# Patient Record
Sex: Male | Born: 1985 | Race: White | Hispanic: No | Marital: Married | Smoking: Former smoker
Health system: Southern US, Community
[De-identification: ages and names within clinical notes are randomized; demographics above are authoritative.]

## PROBLEM LIST (undated history)

## (undated) DIAGNOSIS — I10 Essential (primary) hypertension: Secondary | ICD-10-CM

## (undated) HISTORY — PX: APPENDECTOMY: SHX54

---

## 2013-07-19 ENCOUNTER — Other Ambulatory Visit: Payer: Self-pay | Admitting: Occupational Medicine

## 2013-07-19 ENCOUNTER — Ambulatory Visit
Admission: RE | Admit: 2013-07-19 | Discharge: 2013-07-19 | Disposition: A | Discharge status: Home / Self Care | Payer: Self-pay | Source: Ambulatory Visit | Attending: Occupational Medicine | Admitting: Occupational Medicine

## 2013-07-19 DIAGNOSIS — Z021 Encounter for pre-employment examination: Secondary | ICD-10-CM

## 2014-02-06 ENCOUNTER — Emergency Department (HOSPITAL_COMMUNITY)
Admission: EM | Admit: 2014-02-06 | Discharge: 2014-02-06 | Disposition: A | Discharge status: Home / Self Care | Payer: 59 | Source: Home / Self Care | Attending: Family Medicine | Admitting: Family Medicine

## 2014-02-06 ENCOUNTER — Encounter (HOSPITAL_COMMUNITY): Payer: Self-pay | Admitting: Emergency Medicine

## 2014-02-06 DIAGNOSIS — H6990 Unspecified Eustachian tube disorder, unspecified ear: Secondary | ICD-10-CM

## 2014-02-06 DIAGNOSIS — H6980 Other specified disorders of Eustachian tube, unspecified ear: Secondary | ICD-10-CM

## 2014-02-06 DIAGNOSIS — H6981 Other specified disorders of Eustachian tube, right ear: Secondary | ICD-10-CM

## 2014-02-06 MED ORDER — IPRATROPIUM BROMIDE 0.06 % NA SOLN: 2.0000 | Freq: Four times a day (QID) | NASAL | Status: DC

## 2014-02-06 MED ORDER — PSEUDOEPHEDRINE HCL 60 MG PO TABS: 60.0000 mg | ORAL_TABLET | Freq: Every day | ORAL | Status: DC

## 2014-02-06 NOTE — ED Provider Notes (Signed)
CSN: [id number]     Arrival date & time 02/06/14  4098 History   First MD Initiated Contact with Patient 02/06/14 1120     Chief Complaint  Patient presents with  . Cerumen Impaction   (Consider location/radiation/quality/duration/timing/severity/associated sxs/prior Treatment) HPI Comments: Patient developed the sense of muffled hearing, ear congestion, and dizziness with changes in head position last night. Denies fever, ear pain, recent illness, injury or drainage from ear canal. Symptoms are in right ear.  Reports himself to be otherwise healthy. Works as Astronomer. No recent activities that would cause barotrauma.   The history is provided by the patient.    History reviewed. No pertinent past medical history. History reviewed. No pertinent past surgical history. History reviewed. No pertinent family history. History  Substance Use Topics  . Smoking status: Not on file  . Smokeless tobacco: Not on file  . Alcohol Use: No    Review of Systems  All other systems reviewed and are negative.   Allergies  Review of patient's allergies indicates no known allergies.  Home Medications   Prior to Admission medications   Medication Sig Start Date End Date Taking? Authorizing Provider  ipratropium (ATROVENT) 0.06 % nasal spray Place 2 sprays into both nostrils 4 (four) times daily. X 7 days 02/06/14   Ria Clock, PA  pseudoephedrine (SUDAFED) 60 MG tablet Take 1 tablet (60 mg total) by mouth daily with breakfast. X 5 days 02/06/14   Mathis Fare Presson, PA   BP 113/78  Pulse 60  Temp(Src) 98.2 F (36.8 C) (Oral)  Resp 18  SpO2 100% Physical Exam  Nursing note and vitals reviewed. Constitutional: He is oriented to person, place, and time. He appears well-developed and well-nourished. No distress.  HENT:  Head: Normocephalic and atraumatic.  Right Ear: External ear and ear canal normal. A middle ear effusion is present. Decreased hearing is noted.  Left  Ear: Hearing, tympanic membrane, external ear and ear canal normal.  Nose: Nose normal.  Mouth/Throat: Uvula is midline, oropharynx is clear and moist and mucous membranes are normal. No oral lesions. No trismus in the jaw. No uvula swelling.  +moderate clear fluid effusion at right middle ear.   Eyes: Conjunctivae and EOM are normal. Pupils are equal, round, and reactive to light. Right eye exhibits no discharge. Left eye exhibits no discharge. No scleral icterus.  Neck: Normal range of motion. Neck supple.  Cardiovascular: Normal rate, regular rhythm and normal heart sounds.   Pulmonary/Chest: Effort normal and breath sounds normal.  Musculoskeletal: Normal range of motion.  Lymphadenopathy:    He has no cervical adenopathy.  Neurological: He is alert and oriented to person, place, and time.  Skin: Skin is warm and dry. No rash noted. No erythema.  Psychiatric: His behavior is normal.    ED Course  Procedures (including critical care time) Labs Review Labs Reviewed - No data to display  Imaging Review No results found.   MDM   1. Eustachian tube dysfunction, right   I suspect his symptoms are a result of mild eustachian tube dysfunction causing middle ear effusion. Will advise using Sudafed and Atrovent as prescribed and if no improvement over the next 5-7 days, follow up with Dr. Suszanne Conners (ENT).     Ria Clock, [place] 02/06/14 [phone]

## 2014-02-06 NOTE — Discharge Instructions (Signed)
See attached regarding eustachian tube dysfunction

## 2014-02-06 NOTE — ED Notes (Signed)
Pt  Has  Sensation  Of      Fullness  In  r       Ear           X  sev  Days           No  Pain  No       Drainage

## 2014-02-07 NOTE — ED Provider Notes (Signed)
Medical screening examination/treatment/procedure(s) were performed by a resident physician or non-physician practitioner and as the supervising physician I was immediately available for consultation/collaboration.  Shelly Flattenavid Merrell, MD Family Medicine   Ozella Rocksavid J Merrell, MD 02/07/14 [phone]

## 2014-11-07 ENCOUNTER — Ambulatory Visit (INDEPENDENT_AMBULATORY_CARE_PROVIDER_SITE_OTHER): Payer: 59 | Admitting: Physician Assistant

## 2014-11-07 VITALS — BP 120/84 | HR 73 | Temp 98.3°F | Resp 16 | Ht 71.0 in | Wt 210.4 lb

## 2014-11-07 DIAGNOSIS — M542 Cervicalgia: Secondary | ICD-10-CM

## 2014-11-07 DIAGNOSIS — R202 Paresthesia of skin: Secondary | ICD-10-CM

## 2014-11-07 MED ORDER — PREDNISONE 20 MG PO TABS: ORAL_TABLET | ORAL | Status: DC

## 2014-11-07 NOTE — Progress Notes (Signed)
11/07/2014 at 4:18 PM  Raymond Raymond / DOB: 1986-03-07 / MRN: [id number]  The patient  does not have a problem list on file.  SUBJECTIVE  Chief complaint: possible pinched nerve and Numbness  Patient here for neck pain with radiation to left shoulder.  Reports it started as a "crick in the neck" two days ago and is no having increasing pain with radiation to the left shoulder.  States his pain is worse with head and neck movement.  Has tried ibuprofen 800 tid with mild relief.  Denies change in bowel and bladder, weakness, and change in dexterity.  He has never had any issues with his neck before.  Reports he was doing a new work out 4 days ago and denies any acute injury at that time.    He  has no past medical history on file.    Medications reviewed and updated by myself where necessary, and exist elsewhere in the encounter.   Mr. Lyster has No Known Allergies. He  reports that he has been smoking.  He does not have any smokeless tobacco history on file. He reports that he drinks alcohol. He reports that he does not use illicit drugs. He  has no sexual activity history on file. The patient  has past surgical history that includes Appendectomy.  His family history is not on file.  Review of Systems  Constitutional: Negative for fever.  Gastrointestinal: Negative for nausea.  Musculoskeletal: Positive for neck pain.  Skin: Negative for rash.  Neurological: Negative for dizziness and headaches.    OBJECTIVE  His  height is  (1.803 m) and weight is 210 lb 6.4 oz (95.437 kg). His oral temperature is 98.3 F (36.8 C). His blood pressure is 120/84 and his pulse is 73. His respiration is 16 and oxygen saturation is 99%.  The patient's body mass index is 29.36 kg/(m^2).  Physical Exam  Cardiovascular: Normal rate.   Respiratory: Effort normal.  Musculoskeletal: Normal range of motion.       Cervical back: He exhibits tenderness and pain. He exhibits normal range of motion, no bony  tenderness, no swelling, no edema, no deformity, no laceration, no spasm and normal pulse.       Back:  Neurological: He has normal strength. He displays no tremor. No cranial nerve deficit or sensory deficit. He exhibits normal muscle tone. Coordination normal.  Reflex Scores:      Tricep reflexes are 2+ on the right side and 2+ on the left side.      Bicep reflexes are 2+ on the right side and 2+ on the left side.      Brachioradialis reflexes are 2+ on the right side and 2+ on the left side.   No results found for this or any previous visit (from the past 24 hour(s)).  ASSESSMENT & PLAN  Dolph was seen today for possible pinched nerve and numbness.  Diagnoses and all orders for this visit:  Neck pain: Patient with no previous history of neck pain and without alarming symptoms.  He has failed ibuprofen at an appropriate dose.  Will step up therapy.  He is to follow up here if he does not have any improvement in ten days.   Orders: -     predniSONE (DELTASONE) 20 MG tablet; Take 3 PO QAM x3days, 2 PO QAM x3days, 1 PO QAM x3days  Paresthesia of left upper extremity Orders: -     predniSONE (DELTASONE) 20 MG tablet; Take 3 PO QAM  x3days, 2 PO QAM x3days, 1 PO QAM x3days    The patient was advised to call or come back to clinic if he does not see an improvement in symptoms, or worsens with the above plan.   Raymond BostonMichael Nyaire Raymond, MHS, PA-C [facility] 11/07/2014 4:18 PM

## 2015-09-18 ENCOUNTER — Encounter: Payer: Self-pay | Admitting: Emergency Medicine

## 2015-09-18 ENCOUNTER — Emergency Department
Admission: EM | Admit: 2015-09-18 | Discharge: 2015-09-18 | Disposition: A | Discharge status: Home / Self Care | Payer: 59 | Source: Home / Self Care | Attending: Emergency Medicine | Admitting: Emergency Medicine

## 2015-09-18 DIAGNOSIS — J029 Acute pharyngitis, unspecified: Secondary | ICD-10-CM | POA: Diagnosis not present

## 2015-09-18 MED ORDER — AMOXICILLIN 500 MG PO CAPS: 500.0000 mg | ORAL_CAPSULE | Freq: Three times a day (TID) | ORAL | Status: DC

## 2015-09-18 NOTE — ED Notes (Signed)
Pt c/o sore throat and nasal congestion x2 days. Denies fever.

## 2015-09-18 NOTE — ED Provider Notes (Signed)
CSN: [id number]     Arrival date & time 09/18/15  1705 History   First MD Initiated Contact with Patient 09/18/15 1747     Chief Complaint  Patient presents with  . Sore Throat  . Nasal Congestion   (Consider location/radiation/quality/duration/timing/severity/associated sxs/prior Treatment) Patient is a 30 y.o. male presenting with pharyngitis. The history is provided by the patient.  Sore Throat This is a new problem. The current episode started 2 days ago. The problem occurs constantly. The problem has been gradually worsening. Pertinent negatives include no chest pain and no shortness of breath. Nothing aggravates the symptoms. Nothing relieves the symptoms. He has tried nothing for the symptoms. The treatment provided no relief.   Pt complains of throat felling like razors.  History reviewed. No pertinent past medical history. Past Surgical History  Procedure Laterality Date  . Appendectomy     History reviewed. No pertinent family history. Social History  Substance Use Topics  . Smoking status: Former Games developermoker  . Smokeless tobacco: Current User    Types: Snuff  . Alcohol Use: 0.0 oz/week    0 Standard drinks or equivalent per week    Review of Systems  Respiratory: Negative for shortness of breath.   Cardiovascular: Negative for chest pain.  All other systems reviewed and are negative.   Allergies  Review of patient's allergies indicates no known allergies.  Home Medications   Prior to Admission medications   Medication Sig Start Date End Date Taking? Authorizing Provider  ipratropium (ATROVENT) 0.06 % nasal spray Place 2 sprays into both nostrils 4 (four) times daily. X 7 days Patient not taking: Reported on 11/07/2014 02/06/14   Ria ClockJennifer Lee H Presson, PA  predniSONE (DELTASONE) 20 MG tablet Take 3 PO QAM x3days, 2 PO QAM x3days, 1 PO QAM x3days 11/07/14   Ofilia NeasMichael L Clark, PA-C  pseudoephedrine (SUDAFED) 60 MG tablet Take 1 tablet (60 mg total) by mouth daily with  breakfast. X 5 days Patient not taking: Reported on 11/07/2014 02/06/14   Ria ClockJennifer Lee H Presson, PA   Meds Ordered and Administered this Visit  Medications - No data to display  BP 127/81 mmHg  Pulse 75  Temp(Src) 97.8 F (36.6 C) (Oral)  Wt 211 lb (95.709 kg)  SpO2 97% No data found.   Physical Exam  Constitutional: He is oriented to person, place, and time. He appears well-developed and well-nourished.  HENT:  Head: Normocephalic.  Eyes: EOM are normal.  Neck: Normal range of motion.  Cardiovascular: Normal rate.   Pulmonary/Chest: Effort normal.  Abdominal: Soft. He exhibits no distension.  Musculoskeletal: Normal range of motion.  Neurological: He is alert and oriented to person, place, and time.  Skin: There is erythema.  Psychiatric: He has a normal mood and affect.  Nursing note and vitals reviewed.   ED Course  Procedures (including critical care time)  Labs Review Labs Reviewed - No data to display  Imaging Review No results found.   Visual Acuity Review  Right Eye Distance:   Left Eye Distance:   Bilateral Distance:    Right Eye Near:   Left Eye Near:    Bilateral Near:         MDM   1. Acute pharyngitis, unspecified etiology    Meds ordered this encounter  Medications  . DISCONTD: amoxicillin (AMOXIL) 500 MG capsule    Sig: Take 1 capsule (500 mg total) by mouth 3 (three) times daily.    Dispense:  30 capsule  Refill:  0    Order Specific Question:  Supervising Provider    Answer:  Georgina Pillion, DAVID [[id number]]  . amoxicillin (AMOXIL) 500 MG capsule    Sig: Take 1 capsule (500 mg total) by mouth 3 (three) times daily.    Dispense:  30 capsule    Refill:  0    Order Specific Question:  Supervising Provider    Answer:  Georgina Pillion, DAVID [[id number]]  An After Visit Summary was printed and given to the patient.    Lonia Skinner [place], PA-C 09/18/15 [phone]

## 2015-09-18 NOTE — Discharge Instructions (Signed)

## 2015-11-29 ENCOUNTER — Encounter: Payer: Self-pay | Admitting: Family Medicine

## 2015-11-29 ENCOUNTER — Ambulatory Visit (INDEPENDENT_AMBULATORY_CARE_PROVIDER_SITE_OTHER): Payer: 59 | Admitting: Family Medicine

## 2015-11-29 VITALS — BP 122/80 | HR 64 | Ht 70.0 in | Wt 236.0 lb

## 2015-11-29 DIAGNOSIS — Z0000 Encounter for general adult medical examination without abnormal findings: Secondary | ICD-10-CM | POA: Insufficient documentation

## 2015-11-29 DIAGNOSIS — Z114 Encounter for screening for human immunodeficiency virus [HIV]: Secondary | ICD-10-CM

## 2015-11-29 DIAGNOSIS — Z23 Encounter for immunization: Secondary | ICD-10-CM

## 2015-11-29 DIAGNOSIS — Z720 Tobacco use: Secondary | ICD-10-CM | POA: Diagnosis not present

## 2015-11-29 DIAGNOSIS — IMO0001 Reserved for inherently not codable concepts without codable children: Secondary | ICD-10-CM

## 2015-11-29 DIAGNOSIS — E669 Obesity, unspecified: Secondary | ICD-10-CM

## 2015-11-29 NOTE — Patient Instructions (Signed)
Thank you for coming in today. Return in 1 year or sooner.  Work on reducing tobacco.  Keep active and try to keep the weight off.  Return for TB test and blood work soon.

## 2015-11-29 NOTE — Progress Notes (Signed)
       Raymond [name] is a 30 y.o. male who [name] today for a wellness visit.  Patient has no known medical problems and takes no medications.  He and his wife are fostering a child and he needs a health check up.    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  He admits to eating fast food and gas station food frequently when working.  Patient exercises 3-4 times per week.  He reports going through a can of chewing tobacco every day and drinking 10-15 beers per week.  Denies history of psychiatric disease.  Denies depression and anhedonia.     History reviewed. No pertinent past medical history. Past Surgical History  Procedure Laterality Date  . Appendectomy     Social History  Substance Use Topics  . Smoking status: Former Games developermoker  . Smokeless tobacco: Current User    Types: Snuff  . Alcohol Use: 6.0 oz/week    0 Standard drinks or equivalent, 10 Cans of beer per week   family history includes Cancer in his mother.  ROS as above: No headache, visual changes, nausea, vomiting, diarrhea, constipation, dizziness, abdominal pain, skin rash, fevers, chills, night sweats, weight loss, swollen lymph nodes, body aches, joint swelling, muscle aches, chest pain, shortness of breath, mood changes, visual or auditory hallucinations.   Medications: No current outpatient prescriptions on file.   No current facility-administered medications for this visit.   No Known Allergies   Exam:  BP 122/80 mmHg  Pulse 64  Ht 5\' 10"  (1.778 m)  Wt 236 lb (107.049 kg)  BMI 33.86 kg/m2 Gen: Well NAD HEENT:  MMM Lungs: Normal work of breathing. CTABL Heart: RRR no MRG Abd: NABS, Soft. Nondistended, Nontender. Obese  Exts: Brisk capillary refill, warm and well perfused.   [facility] 11/29/2015 11/07/2014  Decreased Interest 0 0    Down, Depressed, Hopeless 0 0  PHQ - 2 Score 0 0    No results found for this or any previous visit (from the past 24 hour(s)). No results found.   Assessment and Plan: 30 y.o. male with a wellness visit. Patient has continued chewing tobacco and alcohol use. Counseled patient on the importance of at least cutting down on chewing tobacco.    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  He will return next week for fasting labs.   Discussed warning signs or symptoms. Please see discharge instructions. Patient expresses understanding.

## 2015-12-15 LAB — COMPREHENSIVE METABOLIC PANEL
ALK PHOS: 51 U/L (ref 40–115)
ALT: 12 U/L (ref 9–46)
AST: 13 U/L (ref 10–40)
Albumin: 4.1 g/dL (ref 3.6–5.1)
BILIRUBIN TOTAL: 0.8 mg/dL (ref 0.2–1.2)
BUN: 8 mg/dL (ref 7–25)
CALCIUM: 9.1 mg/dL (ref 8.6–10.3)
CO2: 27 mmol/L (ref 20–31)
CREATININE: 1 mg/dL (ref 0.60–1.35)
Chloride: 106 mmol/L (ref 98–110)
GLUCOSE: 84 mg/dL (ref 65–99)
Potassium: 4.4 mmol/L (ref 3.5–5.3)
Sodium: 140 mmol/L (ref 135–146)
Total Protein: 6.5 g/dL (ref 6.1–8.1)

## 2015-12-15 LAB — LIPID PANEL
Cholesterol: 175 mg/dL (ref 125–200)
HDL: 74 mg/dL (ref >=40)
LDL CALC: 89 mg/dL (ref <130)
Total CHOL/HDL Ratio: 2.4 Ratio (ref <=5.0)
Triglycerides: 60 mg/dL (ref <150)
VLDL: 12 mg/dL (ref <30)

## 2015-12-15 LAB — CBC
HEMATOCRIT: 45.4 % (ref 38.5–50.0)
Hemoglobin: 15.1 g/dL (ref 13.2–17.1)
MCH: 30.4 pg (ref 27.0–33.0)
MCHC: 33.3 g/dL (ref 32.0–36.0)
MCV: 91.5 fL (ref 80.0–100.0)
MPV: 10.3 fL (ref 7.5–12.5)
PLATELETS: 168 10*3/uL (ref 140–400)
RBC: 4.96 MIL/uL (ref 4.20–5.80)
RDW: 13.3 % (ref 11.0–15.0)
WBC: 5.3 10*3/uL (ref 3.8–10.8)

## 2015-12-15 LAB — HEMOGLOBIN A1C
Hgb A1c MFr Bld: 5.4 % (ref <5.7)
Mean Plasma Glucose: 108 mg/dL

## 2015-12-15 LAB — TSH: TSH: 0.97 m[IU]/L (ref 0.40–4.50)

## 2015-12-15 LAB — HIV ANTIBODY (ROUTINE TESTING W REFLEX): HIV: NONREACTIVE

## 2015-12-15 LAB — VITAMIN D 25 HYDROXY (VIT D DEFICIENCY, FRACTURES): Vit D, 25-Hydroxy: 28 ng/mL — ABNORMAL LOW (ref 30–100)

## 2015-12-16 ENCOUNTER — Encounter: Payer: Self-pay | Admitting: Family Medicine

## 2015-12-16 DIAGNOSIS — E559 Vitamin D deficiency, unspecified: Secondary | ICD-10-CM | POA: Insufficient documentation

## 2016-08-15 ENCOUNTER — Emergency Department
Admission: EM | Admit: 2016-08-15 | Discharge: 2016-08-15 | Disposition: A | Discharge status: Home / Self Care | Payer: 59 | Source: Home / Self Care | Attending: Family Medicine | Admitting: Family Medicine

## 2016-08-15 ENCOUNTER — Encounter: Payer: Self-pay | Admitting: Emergency Medicine

## 2016-08-15 DIAGNOSIS — S76311A Strain of muscle, fascia and tendon of the posterior muscle group at thigh level, right thigh, initial encounter: Secondary | ICD-10-CM

## 2016-08-15 NOTE — Discharge Instructions (Signed)
°  You may apply an ace wrap or other similar elastic wrap to help give your muscle extra support and provide comfort.    You should use cool compresses such as an ice pack today and tomorrow, then transition into using a warm compress such as a heating pad or warm washcloth in the next few days to help muscle healing.    See more info about hamstring strain in this packet including home exercises as pain improves.   You may take acetaminophen (tylenol)  every 4-6 hours (up to  the first dose) and  600-800mg  ibuprofen every 6-8 hours for pain.

## 2016-08-15 NOTE — ED Provider Notes (Signed)
CSN: [id number]     Arrival date & time 08/15/16  1503 History   First MD Initiated Contact with Patient 08/15/16 1525     Chief Complaint  Patient presents with  . Leg Pain    upper   (Consider location/radiation/quality/duration/timing/severity/associated sxs/prior Treatment) HPI  Raymond [name] is a 31 y.o. male presenting to UC with c/o sudden onset Right upper posterior leg pain that started last night while playing softball. Pt notes when he was rounding 2nd base he felt a popping sensation and immediate pain which caused him to stop running.  Pain gradually improved so he tried to play the rest of the game at 3rd base but could not last after making 1 play due to the pain. Pain is worse with ambulation and certain seated positions, aching and sore, 5/10.  He did take ibuprofen with moderate relief. 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  His next scheduled shift is not until Monday, 08/18/16.  Pt is also requesting a note to wear an external vest and web belt, which is lighter/more comfortable than current equipment to help with back pain.    PCP: Dr. Clementeen Graham, Sports Medicine/Primary Care.    History reviewed. No pertinent past medical history. Past Surgical History:  Procedure Laterality Date  . APPENDECTOMY     Family History  Problem Relation Age of Onset  . Cancer Mother    Social History  Substance Use Topics  . Smoking status: Former Games developer  . Smokeless tobacco: Current User    Types: Snuff  . Alcohol use 6.0 oz/week    10 Cans of beer per week    Review of Systems  Musculoskeletal: Positive for back pain, gait problem and myalgias. Negative for arthralgias and joint swelling.  Skin: Negative for color change, rash and wound.  Neurological: Negative for weakness and numbness.    Allergies  Patient has no known allergies.  Home Medications   Prior to Admission medications   Not on File   Meds Ordered and Administered  this Visit  Medications - No data to display  BP 124/85 (BP Location: Left Arm)   Pulse 100   Temp 98.4 F (36.9 C) (Oral)   Resp 16   Ht  (1.778 m)   Wt 240 lb (108.9 kg)   SpO2 98%   BMI 34.44 kg/m  No data found.   Physical Exam  Constitutional: He is oriented to person, place, and time. He appears well-developed and well-nourished.  HENT:  Head: Normocephalic and atraumatic.  Eyes: EOM are normal.  Neck: Normal range of motion.  Cardiovascular: Normal rate.   Pulmonary/Chest: Effort normal.  Musculoskeletal: Normal range of motion. He exhibits tenderness. He exhibits no edema or deformity.  Right upper leg: no obvious edema or deformity.  Tenderness to middle of posterior hamstring. Muscle compartment is soft. No bruising noted. Full ROM Right hip, knee and ankle.   Neurological: He is alert and oriented to person, place, and time.  Skin: Skin is warm and dry.  Psychiatric: He has a normal mood and affect. His behavior is normal.  Nursing note and vitals reviewed.   Urgent Care Course     Procedures (including critical care time)  Labs Review Labs Reviewed - No data to display  Imaging Review No results found.    MDM   1. Right hamstring muscle strain, initial encounter    Hx and exam c/w Right hamstring muscle  strain.  Ace wrap applied for comfort. Encouraged alternating ice and heat. May take acetaminophen and ibuprofen. Home care instructions with home exercises provided for pt to start as pain eases off.  Work note provided for tonight off. f/u with Dr. Denyse Amass, PCP/Sports Medicine next week if not improving.     Junius Finner, PA-C 08/15/16 [phone]

## 2016-08-15 NOTE — ED Triage Notes (Signed)
Patient was running bases during softball last night and felt popping sensation in upperright leg. Now painful and has taken ibuprofen this morning.

## 2016-11-17 ENCOUNTER — Encounter (HOSPITAL_BASED_OUTPATIENT_CLINIC_OR_DEPARTMENT_OTHER): Payer: Self-pay | Admitting: *Deleted

## 2016-11-17 ENCOUNTER — Emergency Department (HOSPITAL_BASED_OUTPATIENT_CLINIC_OR_DEPARTMENT_OTHER)
Admission: EM | Admit: 2016-11-17 | Discharge: 2016-11-17 | Disposition: A | Discharge status: Home / Self Care | Payer: Worker's Compensation | Attending: Emergency Medicine | Admitting: Emergency Medicine

## 2016-11-17 ENCOUNTER — Emergency Department (HOSPITAL_BASED_OUTPATIENT_CLINIC_OR_DEPARTMENT_OTHER): Payer: Worker's Compensation

## 2016-11-17 DIAGNOSIS — S6990XA Unspecified injury of unspecified wrist, hand and finger(s), initial encounter: Secondary | ICD-10-CM

## 2016-11-17 DIAGNOSIS — Y929 Unspecified place or not applicable: Secondary | ICD-10-CM | POA: Diagnosis not present

## 2016-11-17 DIAGNOSIS — F17220 Nicotine dependence, chewing tobacco, uncomplicated: Secondary | ICD-10-CM | POA: Insufficient documentation

## 2016-11-17 DIAGNOSIS — R000 Tachycardia, unspecified: Secondary | ICD-10-CM | POA: Insufficient documentation

## 2016-11-17 DIAGNOSIS — Y990 Civilian activity done for income or pay: Secondary | ICD-10-CM | POA: Insufficient documentation

## 2016-11-17 DIAGNOSIS — S60940A Unspecified superficial injury of right index finger, initial encounter: Secondary | ICD-10-CM | POA: Diagnosis present

## 2016-11-17 DIAGNOSIS — Y939 Activity, unspecified: Secondary | ICD-10-CM | POA: Diagnosis not present

## 2016-11-17 NOTE — ED Notes (Signed)
Patient transported to X-ray 
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
## 2016-11-17 NOTE — ED Notes (Signed)
Pt verbalizes understanding of dc instructions and denies any further need at this time.  Pt given strict return precautions for chest pain, SOB, numbness or weakness

## 2016-11-17 NOTE — Discharge Instructions (Signed)
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
## 2017-09-30 ENCOUNTER — Encounter: Payer: Self-pay | Admitting: Family Medicine

## 2017-09-30 ENCOUNTER — Ambulatory Visit (INDEPENDENT_AMBULATORY_CARE_PROVIDER_SITE_OTHER): Payer: 59 | Admitting: Family Medicine

## 2017-09-30 VITALS — BP 134/85 | HR 87 | Ht 70.0 in | Wt 266.0 lb

## 2017-09-30 DIAGNOSIS — Z111 Encounter for screening for respiratory tuberculosis: Secondary | ICD-10-CM | POA: Diagnosis not present

## 2017-09-30 DIAGNOSIS — Z6838 Body mass index (BMI) 38.0-38.9, adult: Secondary | ICD-10-CM | POA: Diagnosis not present

## 2017-09-30 DIAGNOSIS — Z0000 Encounter for general adult medical examination without abnormal findings: Secondary | ICD-10-CM

## 2017-09-30 DIAGNOSIS — K1321 Leukoplakia of oral mucosa, including tongue: Secondary | ICD-10-CM | POA: Diagnosis not present

## 2017-09-30 DIAGNOSIS — E6609 Other obesity due to excess calories: Secondary | ICD-10-CM | POA: Diagnosis not present

## 2017-09-30 DIAGNOSIS — Z720 Tobacco use: Secondary | ICD-10-CM

## 2017-09-30 NOTE — Progress Notes (Signed)
Raymond [name] is a 32 y.o. male who presents to [facility]: [facility] today for well adult visit.  Raymond [name] is doing reasonably well however he notes that he is gained weight over the last several years.  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  He enjoys the night shift and finds that his eating habits and sleep habits have become somewhat disorganized.  He notes additionally he has several small children at home that have increased his stress level.  He notes that he is not exercising regularly nor being careful about his diet.  However overall he is feeling quite well with no fevers chills nausea vomiting or diarrhea.  He continues to use oral tobacco and notes a white patch along his anterior lower gumline.   ROS as above:  Exam:  BP 134/85   Pulse 87   Ht  (1.778 m)   Wt 266 lb (120.7 kg)   BMI 38.17 kg/m    Gen: Well NAD HEENT: EOMI,  MMM white patch along the right lower gingiva.  Lungs: Normal work of breathing. CTABL Heart: RRR no MRG Abd: NABS, Soft. Nondistended, Nontender Exts: Brisk capillary refill, warm and well perfused.   Depression screen [facility] 2/9 09/30/2017 11/29/2015 11/07/2014  Decreased Interest 0 0 0  Down, Depressed, Hopeless 0 0 0  PHQ - 2 Score 0 0 0  Altered sleeping 0 - -  Tired, decreased energy 0 - -  Change in appetite 0 - -  Feeling bad or failure about yourself  0 - -  Trouble concentrating 0 - -  Moving slowly or fidgety/restless 0 - -  Suicidal thoughts 0 - -  PHQ-9 Score 0 - -  Difficult doing work/chores Not difficult at all - -     Lab and Radiology Results No results found for this or any previous visit (from the past 72 hour(s)). No results found.   Assessment and Plan: 32 y.o. male with  Well adult.  Doing reasonably well however patient has gained weight recently.  Discussed diet and exercise strategies.  Plan for basic  fasting labs and calorie restricted diet.  Refer to ENT for white patch and mouth.  Suspicious for leukoplakia.  Patient may benefit from biopsy given oral tobacco history.  Counseled regarding alcohol and oral tobacco.  Additionally will administer PPD testing as part of adoption paperwork. Recheck 48 hrs    Orders Placed This Encounter  Procedures  . CBC  . Lipid Panel w/reflex Direct LDL  . COMPLETE METABOLIC PANEL WITH GFR  . Ambulatory referral to ENT    Referral Priority:   Routine    Referral Type:   Consultation    Referral Reason:   Specialty Services Required    Requested Specialty:   Otolaryngology    Number of Visits Requested:   1  . TB Skin Test    Order Specific Question:   Has patient ever tested positive?    Answer:   No   No orders of the defined types were placed in this encounter.    Historical information moved to improve visibility of documentation.  No past medical history on file. Past Surgical History:  Procedure Laterality Date  . APPENDECTOMY     Social History   Tobacco Use  . Smoking status: Former Games developer  . Smokeless tobacco: Current User    Types: Snuff  Substance Use Topics  . Alcohol use: Yes  Alcohol/week: 6.0 oz    Types: 10 Cans of beer per week   family history includes Cancer in his mother.  Medications: No current outpatient medications on file.   No current facility-administered medications for this visit.    No Known Allergies  Health Maintenance Health Maintenance  Topic Date Due  . INFLUENZA VACCINE  12/10/2017  . TETANUS/TDAP  11/28/2025  . HIV Screening  Completed    Discussed warning signs or symptoms. Please see discharge instructions. Patient expresses understanding.

## 2017-09-30 NOTE — Patient Instructions (Addendum)
Thank you for coming in today. Get fasting labs now.  Recheck yearly . Work on a [employer].  Aim for <2000 calories a day.  Use myfitness pal or lose it as a calorie tracker.  Having a buddy doing the same thing is helpful.   I will get lab results to you ASAP.    Leukoplakia Leukoplakia refers to white patches that develop in your mouth. These patches may show up on the insides of your cheeks, on or under your tongue, or on your gums. Leukoplakia can also develop on the outer area of the male genitalia (vulva ). In rare cases, it can occur in the area around the anus. Leukoplakia usually goes away with treatment. In some cases, leukoplakia can indicate an increased risk of cancer. What are the causes? Many conditions can cause or increase the risk of leukoplakia in the mouth. These may include:  Any type of tobacco use, especially when combined with the use of alcohol.  Irritation of the mouth from rough teeth or dentures.  Having a weakened defense system (immune system), such as occurs with HIV or AIDS or after a bone marrow transplant.  Many conditions can also cause or increase the risk of leukoplakia of the vulva, including:  Long-lasting infections of the vulva.  Some skin diseases.  Long-lasting irritation of the vulva.  Long-term use of steroid creams or ointments.  Having a weakened immune system, such as occurs with HIV or AIDS or after a bone marrow transplant.  What are the signs or symptoms? The main symptom is the development of patches or flat areas in the mouth or on the vulva. These patches may be:  Odd shaped.  Hard.  Raised.  White or gray in color. Some areas may be reddened.  Unable to be wiped away or scraped away.  Fuzzy.  Sensitive to touch, heat, or foods that are spicy or acidic.  How is this diagnosed? Leukoplakia has a distinct appearance, so your health care provider can usually make a diagnosis by closely examining the  affected area. A sample of the white patch may be removed (biopsy) and examined under a microscope. This helps to confirm the diagnosis and make sure that it is not a form of cancer. How is this treated? Treatment for leukoplakia may include:  Stopping tobacco use.  Repairing any rough teeth or dentures.  Surgical removal of the patch. This may be done using a surgical knife (scalpel), laser, heat, or cold.  Medicines that can be taken by mouth.  Medicines that can be applied to the patches.  Follow these instructions at home:  Do not use any tobacco products, including cigarettes, chewing tobacco, or electronic cigarettes. If you need help quitting, ask your health care provider.  Check with your dentist to see if you need any repairs to teeth or dentures.  Take medicines only as directed by your health care provider.  Avoid foods or beverages that seem to irritate the leukoplakia. Contact a health care provider if:  You develop new patches of leukoplakia.  You notice changes in the size, shape, or feeling of existing patches.  You have a fever. Get help right away if:  You have severe pain in the area of a patch, and the pain is not helped by prescribed medicine.  You have bleeding in the area of a patch, and you cannot stop the bleeding.  You have a patch in your mouth that becomes so swollen that you have trouble eating  or breathing.  You have a patch at the vulva that becomes so swollen that you have trouble passing urine. This information is not intended to replace advice given to you by your health care provider. Make sure you discuss any questions you have with your health care provider. Document Released: 04/10/2008 Document Revised: 10/04/2015 Document Reviewed: 12/06/2013 Elsevier Interactive Patient Education  [employer].

## 2017-10-01 LAB — COMPLETE METABOLIC PANEL WITH GFR
AG Ratio: 1.9 (calc) (ref 1.0–2.5)
ALBUMIN MSPROF: 4.3 g/dL (ref 3.6–5.1)
ALKALINE PHOSPHATASE (APISO): 77 U/L (ref 40–115)
ALT: 24 U/L (ref 9–46)
AST: 21 U/L (ref 10–40)
BUN: 7 mg/dL (ref 7–25)
CALCIUM: 9.2 mg/dL (ref 8.6–10.3)
CO2: 27 mmol/L (ref 20–32)
CREATININE: 0.93 mg/dL (ref 0.60–1.35)
Chloride: 103 mmol/L (ref 98–110)
GFR, EST NON AFRICAN AMERICAN: 110 mL/min/{1.73_m2} (ref >=60)
GFR, Est African American: 127 mL/min/{1.73_m2} (ref >=60)
GLOBULIN: 2.3 g/dL (ref 1.9–3.7)
GLUCOSE: 85 mg/dL (ref 65–99)
Potassium: 4.3 mmol/L (ref 3.5–5.3)
SODIUM: 138 mmol/L (ref 135–146)
TOTAL PROTEIN: 6.6 g/dL (ref 6.1–8.1)
Total Bilirubin: 0.7 mg/dL (ref 0.2–1.2)

## 2017-10-01 LAB — LIPID PANEL W/REFLEX DIRECT LDL
Cholesterol: 193 mg/dL (ref <200)
HDL: 69 mg/dL (ref >40)
LDL CHOLESTEROL (CALC): 103 mg/dL — AB
Non-HDL Cholesterol (Calc): 124 mg/dL (calc) (ref <130)
Total CHOL/HDL Ratio: 2.8 (calc) (ref <5.0)
Triglycerides: 111 mg/dL (ref <150)

## 2017-10-01 LAB — CBC
HCT: 46.4 % (ref 38.5–50.0)
Hemoglobin: 16.3 g/dL (ref 13.2–17.1)
MCH: 31.5 pg (ref 27.0–33.0)
MCHC: 35.1 g/dL (ref 32.0–36.0)
MCV: 89.7 fL (ref 80.0–100.0)
MPV: 10.9 fL (ref 7.5–12.5)
PLATELETS: 181 10*3/uL (ref 140–400)
RBC: 5.17 10*6/uL (ref 4.20–5.80)
RDW: 12.5 % (ref 11.0–15.0)
WBC: 6.6 10*3/uL (ref 3.8–10.8)

## 2017-10-02 ENCOUNTER — Ambulatory Visit (INDEPENDENT_AMBULATORY_CARE_PROVIDER_SITE_OTHER): Payer: 59 | Admitting: Family Medicine

## 2017-10-02 VITALS — BP 114/74 | HR 97

## 2017-10-02 DIAGNOSIS — Z111 Encounter for screening for respiratory tuberculosis: Secondary | ICD-10-CM | POA: Diagnosis not present

## 2017-10-02 LAB — TB SKIN TEST
INDURATION: 0 mm
TB SKIN TEST: NEGATIVE

## 2017-10-02 MED ORDER — VARENICLINE TARTRATE 0.5 MG X 11 & 1 MG X 42 PO MISC: ORAL | 0 refills | Status: DC

## 2017-10-02 NOTE — Progress Notes (Signed)
Pt came into clinic today for PPD read. PPD test was negative. He has never tested positive before. [place] form completed and provided to Pt along with a printed copy. Pt did speak with PCP regarding tobacco cessation.

## 2017-10-02 NOTE — Progress Notes (Signed)
Patient was seen in clinic 2 days ago for wellness visit.  At that time he had a white lesion on his lower gum.  He is a heavy user of oral tobacco.  I was concerned for leukoplakia and referred to ENT.  He is here today for previous arrange PPD follow-up and notes that he has discontinued oral tobacco completely.  He is having struggles with cravings and is interested in Chantix if possible.  Prescription sent.  Note he notes the whitish lesion has improved but he has a follow-up appoint with ENT in about a week.  PPD negative.  Form filled out.

## 2018-07-01 ENCOUNTER — Ambulatory Visit: Payer: Self-pay | Admitting: Family Medicine

## 2018-07-02 ENCOUNTER — Ambulatory Visit (INDEPENDENT_AMBULATORY_CARE_PROVIDER_SITE_OTHER): Payer: 59 | Admitting: Family Medicine

## 2018-07-02 ENCOUNTER — Encounter: Payer: Self-pay | Admitting: Family Medicine

## 2018-07-02 ENCOUNTER — Ambulatory Visit: Payer: Self-pay | Admitting: Family Medicine

## 2018-07-02 VITALS — BP 137/87 | HR 94 | Ht 70.0 in | Wt 269.0 lb

## 2018-07-02 DIAGNOSIS — K1370 Unspecified lesions of oral mucosa: Secondary | ICD-10-CM

## 2018-07-02 DIAGNOSIS — R002 Palpitations: Secondary | ICD-10-CM | POA: Diagnosis not present

## 2018-07-02 DIAGNOSIS — Z6838 Body mass index (BMI) 38.0-38.9, adult: Secondary | ICD-10-CM

## 2018-07-02 DIAGNOSIS — E6609 Other obesity due to excess calories: Secondary | ICD-10-CM

## 2018-07-02 DIAGNOSIS — I10 Essential (primary) hypertension: Secondary | ICD-10-CM | POA: Insufficient documentation

## 2018-07-02 MED ORDER — LISINOPRIL 10 MG PO TABS: 10.0000 mg | ORAL_TABLET | Freq: Every day | ORAL | 3 refills | Status: DC

## 2018-07-02 NOTE — Progress Notes (Signed)
Raymond [name] is a 33 y.o. male who presents to [place]: [place] today for bump in mouth, and palpitations and hypertension.  Raymond [name] notes a little bump in his right lower mouth.  He has a history of oral tobacco use.  He is quit dipping in September 2019 is switched to a tobacco free nicotine patch.  He notes a new bump in the right lower jaw that is not bothersome.  He can feel it but not see it.  He also notes a bit of gum inflammation at the right lower canine.  He does have a dentist but it is been sometime since his last visit.    Raymond [name] additionally notes a sensation of palpitations.  This occurs throughout the day but predominantly at bedtime and interferes with sleep.  He notes this syndrome occurs with times of stress and exercise as well.  He denies chest pain or significant shortness of breath.  Raymond [name] is concerned about the possibility of heart issue.  He has had weight gain over the last several years.  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  ROS as above:  Exam:  BP 137/87   Pulse 94   Ht _0  (1.778 m)   Wt 269 lb (122 kg)   BMI 38.60 kg/m  Wt Readings from Last 5 Encounters:  07/02/18 269 lb (122 kg)  09/30/17 266 lb (120.7 kg)  11/17/16 220 lb (99.8 kg)  08/15/16 240 lb (108.9 kg)  11/29/15 236 lb (107 kg)    Gen: Well NAD HEENT: EOMI,  MMM no visible oral lesions.  Slight gingival disease right lower canine.  No palpable masses per my exam. Lungs: Normal work of breathing. CTABL Heart: RRR no MRG Abd: NABS, Soft. Nondistended, Nontender Exts: Brisk capillary refill, warm and well perfused.   Lab and Radiology Results  Twelve-lead EKG: Normal sinus rhythm at 74 bpm.  No ST segment elevation or depression.  Voltage criteria for LVH partially  met.  Assessment and Plan: 33 y.o. male with  Oral lesions.  No clear oral lesions visible per my exam today.  Patient does have risk and would benefit from watchful waiting.  Recommend follow-up with dentist.  Additionally this will help with his mild gingival disease as well.  Congratulations on quitting oral tobacco.  Palpitations: Very likely PACs however patient would benefit from work-up given his hypertension.  Plan for Holter monitor and for echocardiogram given hypertension and mild LVH on EKG.  Hypertension: New diagnosis today.  Patient has had several episodes of elevated blood pressure on recent exams.  He has had weight gain which is likely a factor here.  Plan for lisinopril and recheck in 1 month.  Will reassess metabolic panel at that visit.  Last labs were normal limits 2019.  Obesity: Discussed strategies including a lower carbohydrate and lower calorie diet.  Will reassess and talk about this issue more at the next visit.  PDMP reviewed during this encounter. Orders Placed This Encounter  Procedures  . Holter monitor - 48 hour    Standing Status:   Future    Standing Expiration Date:   07/02/2028    Order Specific Question:   Where should this test be performed?    Answer:   [facility]  . EKG 12-Lead  . ECHOCARDIOGRAM COMPLETE  Standing Status:   Future    Standing Expiration Date:   09/30/2019    Order Specific Question:   Where should this test be performed    Answer:   [facility]    Order Specific Question:   Perflutren DEFINITY (image enhancing agent) should be administered unless hypersensitivity or allergy exist    Answer:   Administer Perflutren    Order Specific Question:   Reason for exam-Echo    Answer:   Abnormal ECG  794.31 / R94.31    Order Specific Question:   Other Comments    Answer:   can schedule in [place] if easier for patient   Meds ordered this encounter  Medications  . lisinopril (PRINIVIL,ZESTRIL) 10 MG tablet    Sig: Take 1  tablet (10 mg total) by mouth daily.    Dispense:  30 tablet    Refill:  3     Historical information moved to improve visibility of documentation.  No past medical history on file. Past Surgical History:  Procedure Laterality Date  . APPENDECTOMY     Social History   Tobacco Use  . Smoking status: Former Research scientist (life sciences)  . Smokeless tobacco: Former Systems developer    Types: Snuff    Quit date: 01/10/2018  Substance Use Topics  . Alcohol use: Yes    Alcohol/week: 10.0 standard drinks    Types: 10 Cans of beer per week   family history includes Cancer in his mother.  Medications: Current Outpatient Medications  Medication Sig Dispense Refill  . lisinopril (PRINIVIL,ZESTRIL) 10 MG tablet Take 1 tablet (10 mg total) by mouth daily. 30 tablet 3   No current facility-administered medications for this visit.    No Known Allergies   Discussed warning signs or symptoms. Please see discharge instructions. Patient expresses understanding.

## 2018-07-02 NOTE — Patient Instructions (Signed)
Thank you for coming in today. Start lisinopril.  You should hear about the holter monitor.  You should also hear about the ECHO.   Set calorie goal of 2000 calories a day.  Use myfitnesspal.   Recheck in 1 month.    Lisinopril tablets What is this medicine? LISINOPRIL (lyse [place] pril) is an ACE inhibitor. This medicine is used to treat high blood pressure and heart failure. It is also used to protect the heart immediately after a heart attack. This medicine may be used for other purposes; ask your health care provider or pharmacist if you have questions. COMMON BRAND NAME(S): Prinivil, Zestril What should I tell my health care provider before I take this medicine? They need to know if you have any of these conditions: -diabetes -heart or blood vessel disease -kidney disease -low blood pressure -previous swelling of the tongue, face, or lips with difficulty breathing, difficulty swallowing, hoarseness, or tightening of the throat -an unusual or allergic reaction to lisinopril, other ACE inhibitors, insect venom, foods, dyes, or preservatives -pregnant or trying to get pregnant -breast-feeding How should I use this medicine? Take this medicine by mouth with a glass of water. Follow the directions on your prescription label. You may take this medicine with or without food. If it upsets your stomach, take it with food. Take your medicine at regular intervals. Do not take it more often than directed. Do not stop taking except on your doctor's advice. Talk to your pediatrician regarding the use of this medicine in children. Special care may be needed. While this drug may be prescribed for children as young as 756 years of age for selected conditions, precautions do apply. Overdosage: If you think you have taken too much of this medicine contact a poison control center or emergency room at once. NOTE: This medicine is only for you. Do not share this medicine with others. What if I miss a  dose? If you miss a dose, take it as soon as you can. If it is almost time for your next dose, take only that dose. Do not take double or extra doses. What may interact with this medicine? Do not take this medicine with any of the following medications: -hymenoptera venom -sacubitril; valsartan This medicines may also interact with the following medications: -aliskiren -angiotensin receptor blockers, like losartan or valsartan -certain medicines for diabetes -diuretics -everolimus -gold compounds -lithium -NSAIDs, medicines for pain and inflammation, like ibuprofen or naproxen -potassium salts or supplements -salt substitutes -sirolimus -temsirolimus This list may not describe all possible interactions. Give your health care provider a list of all the medicines, herbs, non-prescription drugs, or dietary supplements you use. Also tell them if you smoke, drink alcohol, or use illegal drugs. Some items may interact with your medicine. What should I watch for while using this medicine? Visit your doctor or health care professional for regular check ups. Check your blood pressure as directed. Ask your doctor what your blood pressure should be, and when you should contact him or her. Do not treat yourself for coughs, colds, or pain while you are using this medicine without asking your doctor or health care professional for advice. Some ingredients may increase your blood pressure. Women should inform their doctor if they wish to become pregnant or think they might be pregnant. There is a potential for serious side effects to an unborn child. Talk to your health care professional or pharmacist for more information. Check with your doctor or health care professional if you  get an attack of severe diarrhea, nausea and vomiting, or if you sweat a lot. The loss of too much body fluid can make it dangerous for you to take this medicine. You may get drowsy or dizzy. Do not drive, use machinery, or do  anything that needs mental alertness until you know how this drug affects you. Do not stand or sit up quickly, especially if you are an older patient. This reduces the risk of dizzy or fainting spells. Alcohol can make you more drowsy and dizzy. Avoid alcoholic drinks. Avoid salt substitutes unless you are told otherwise by your doctor or health care professional. What side effects may I notice from receiving this medicine? Side effects that you should report to your doctor or health care professional as soon as possible: -allergic reactions like skin rash, itching or hives, swelling of the hands, feet, face, lips, throat, or tongue -breathing problems -signs and symptoms of kidney injury like trouble passing urine or change in the amount of urine -signs and symptoms of increased potassium like muscle weakness; chest pain; or fast, irregular heartbeat -signs and symptoms of liver injury like dark yellow or brown urine; general ill feeling or flu-like symptoms; light-colored stools; loss of appetite; nausea; right upper belly pain; unusually weak or tired; yellowing of the eyes or skin -signs and symptoms of low blood pressure like dizziness; feeling faint or lightheaded, falls; unusually weak or tired -stomach pain with or without nausea and vomiting Side effects that usually do not require medical attention (report to your doctor or health care professional if they continue or are bothersome): -changes in taste -cough -dizziness -fever -headache -sensitivity to light This list may not describe all possible side effects. Call your doctor for medical advice about side effects. You may report side effects to FDA at 1-800-FDA-1088. Where should I keep my medicine? Keep out of the reach of children. Store at room temperature between 15 and 30 degrees C (59 and 86 degrees F). Protect from moisture. Keep container tightly closed. Throw away any unused medicine after the expiration date. NOTE: This  sheet is a summary. It may not cover all possible information. If you have questions about this medicine, talk to your doctor, pharmacist, or health care provider.  2019 Elsevier/Gold Standard (2015-06-18 12:52:35)    Palpitations Palpitations are feelings that your heartbeat is irregular or is faster than normal. It may feel like your heart is fluttering or skipping a beat. Palpitations are usually not a serious problem. They may be caused by many things, including smoking, caffeine, alcohol, stress, and certain medicines or drugs. Most causes of palpitations are not serious. However, some palpitations can be a sign of a serious problem. You may need further tests to rule out serious medical problems. Follow these instructions at home:     Pay attention to any changes in your condition. Take these actions to help manage your symptoms: Eating and drinking  Avoid foods and drinks that may cause palpitations. These may include: ? Caffeinated coffee, tea, soft drinks, diet pills, and energy drinks. ? Chocolate. ? Alcohol. Lifestyle  Take steps to reduce your stress and anxiety. Things that can help you relax include: ? Yoga. ? Mind-body activities, such as deep breathing, meditation, or using words and images to create positive thoughts (guided imagery). ? Physical activity, such as swimming, jogging, or walking. Tell your health care provider if your palpitations increase with activity. If you have chest pain or shortness of breath with activity, do not continue  the activity until you are seen by your health care provider. ? Biofeedback. This is a method that helps you learn to use your mind to control things in your body, such as your heartbeat.  Do not use drugs, including cocaine or ecstasy. Do not use marijuana.  Get plenty of rest and sleep. Keep a regular bed time. General instructions  Take over-the-counter and prescription medicines only as told by your health care  provider.  Do not use any products that contain nicotine or tobacco, such as cigarettes and e-cigarettes. If you need help quitting, ask your health care provider.  Keep all follow-up visits as told by your health care provider. This is important. These may include visits for further testing if palpitations do not go away or get worse. Contact a health care provider if you:  Continue to have a fast or irregular heartbeat after 24 hours.  Notice that your palpitations occur more often. Get help right away if you:  Have chest pain or shortness of breath.  Have a severe headache.  Feel dizzy or you faint. Summary  Palpitations are feelings that your heartbeat is irregular or is faster than normal. It may feel like your heart is fluttering or skipping a beat.  Palpitations may be caused by many things, including smoking, caffeine, alcohol, stress, certain medicines, and drugs.  Although most causes of palpitations are not serious, some causes can be a sign of a serious medical problem.  Get help right away if you faint or have chest pain, shortness of breath, a severe headache, or dizziness. This information is not intended to replace advice given to you by your health care provider. Make sure you discuss any questions you have with your health care provider. Document Released: 04/25/2000 Document Revised: 06/10/2017 Document Reviewed: 06/10/2017 Elsevier Interactive Patient Education  2019 [employer].

## 2018-07-13 ENCOUNTER — Telehealth: Payer: Self-pay

## 2018-07-13 DIAGNOSIS — K1370 Unspecified lesions of oral mucosa: Secondary | ICD-10-CM

## 2018-07-13 DIAGNOSIS — K1321 Leukoplakia of oral mucosa, including tongue: Secondary | ICD-10-CM

## 2018-07-13 DIAGNOSIS — Z720 Tobacco use: Secondary | ICD-10-CM

## 2018-07-13 NOTE — Telephone Encounter (Signed)
Patient request referral for ENT regarding his last office visit note. Please advise. Rhonda Cunningham,CMA

## 2018-07-13 NOTE — Telephone Encounter (Signed)
Spoke to patient and advised that referral has been placed. Rhonda Cunningham,CMA

## 2018-07-13 NOTE — Telephone Encounter (Signed)
ENT referral placed.  Patient should hear something in the next week or so.  Patient should contact us to let us know if he does not hear anything about referral.

## 2018-07-16 ENCOUNTER — Ambulatory Visit (INDEPENDENT_AMBULATORY_CARE_PROVIDER_SITE_OTHER): Payer: 59

## 2018-07-16 DIAGNOSIS — R002 Palpitations: Secondary | ICD-10-CM

## 2018-08-02 ENCOUNTER — Ambulatory Visit: Payer: 59 | Admitting: Family Medicine

## 2019-06-06 ENCOUNTER — Ambulatory Visit (INDEPENDENT_AMBULATORY_CARE_PROVIDER_SITE_OTHER): Payer: 59

## 2019-06-06 ENCOUNTER — Other Ambulatory Visit (INDEPENDENT_AMBULATORY_CARE_PROVIDER_SITE_OTHER): Payer: 59 | Admitting: Nurse Practitioner

## 2019-06-06 ENCOUNTER — Other Ambulatory Visit: Payer: Self-pay

## 2019-06-06 ENCOUNTER — Telehealth: Payer: Self-pay | Admitting: Nurse Practitioner

## 2019-06-06 ENCOUNTER — Encounter: Payer: Self-pay | Admitting: Nurse Practitioner

## 2019-06-06 ENCOUNTER — Ambulatory Visit: Payer: 59 | Admitting: Nurse Practitioner

## 2019-06-06 ENCOUNTER — Telehealth: Payer: Self-pay

## 2019-06-06 VITALS — BP 141/100 | HR 92 | Temp 98.2°F | Resp 12 | Ht 70.0 in | Wt 261.0 lb

## 2019-06-06 DIAGNOSIS — R319 Hematuria, unspecified: Secondary | ICD-10-CM

## 2019-06-06 DIAGNOSIS — I10 Essential (primary) hypertension: Secondary | ICD-10-CM | POA: Diagnosis not present

## 2019-06-06 DIAGNOSIS — N200 Calculus of kidney: Secondary | ICD-10-CM

## 2019-06-06 LAB — POCT URINALYSIS DIP (CLINITEK)

## 2019-06-06 LAB — PSA: PSA: 0.4 ng/mL (ref <=4.0)

## 2019-06-06 MED ORDER — LISINOPRIL 10 MG PO TABS: 10.0000 mg | ORAL_TABLET | Freq: Every day | ORAL | 3 refills | Status: DC

## 2019-06-06 MED ORDER — TAMSULOSIN HCL 0.4 MG PO CAPS: 0.4000 mg | ORAL_CAPSULE | Freq: Every day | ORAL | 0 refills | Status: DC

## 2019-06-06 MED ORDER — OXYCODONE-ACETAMINOPHEN 5-325 MG PO TABS: 1.0000 | ORAL_TABLET | Freq: Four times a day (QID) | ORAL | 0 refills | Status: DC | PRN

## 2019-06-06 NOTE — Patient Instructions (Addendum)
Hematuria, Adult Hematuria is blood in the urine. Blood may be visible in the urine, or it may be identified with a test. This condition can be caused by infections of the bladder, urethra, kidney, or prostate. Other possible causes include:  Kidney stones.  Cancer of the urinary tract.  Too much calcium in the urine.  Conditions that are passed from parent to child (inherited conditions).  Exercise that requires a lot of energy. Infections can usually be treated with medicine, and a kidney stone usually will pass through your urine. If neither of these is the cause of your hematuria, more tests may be needed to identify the cause of your symptoms. It is very important to tell your health care provider about any blood in your urine, even if it is painless or the blood stops without treatment. Blood in the urine, when it happens and then stops and then happens again, can be a symptom of a very serious condition, including cancer. There is no pain in the initial stages of many urinary cancers. Follow these instructions at home: Medicines  Take over-the-counter and prescription medicines only as told by your health care provider.  If you were prescribed an antibiotic medicine, take it as told by your health care provider. Do not stop taking the antibiotic even if you start to feel better. Eating and drinking  Drink enough fluid to keep your urine clear or pale yellow. It is recommended that you drink 3-4 quarts (2.8-3.8 L) a day. If you have been diagnosed with an infection, it is recommended that you drink cranberry juice in addition to large amounts of water.  Avoid caffeine, tea, and carbonated beverages. These tend to irritate the bladder.  Avoid alcohol because it may irritate the prostate (men). General instructions  If you have been diagnosed with a kidney stone, follow your health care provider's instructions about straining your urine to catch the stone.  Empty your bladder  often. Avoid holding urine for long periods of time.  If you are male: ? After a bowel movement, wipe from front to back and use each piece of toilet paper only once. ? Empty your bladder before and after sex.  Pay attention to any changes in your symptoms. Tell your health care provider about any changes or any new symptoms.  It is your responsibility to get your test results. Ask your health care provider, or the department performing the test, when your results will be ready.  Keep all follow-up visits as told by your health care provider. This is important. Contact a health care provider if:  You develop back pain.  You have a fever.  You have nausea or vomiting.  Your symptoms do not improve after 3 days.  Your symptoms get worse. Get help right away if:  You develop severe vomiting and are unable take medicine without vomiting.  You develop severe pain in your back or abdomen even though you are taking medicine.  You pass a large amount of blood in your urine.  You pass blood clots in your urine.  You feel very weak or like you might faint.  You faint. Summary  Hematuria is blood in the urine. It has many possible causes.  It is very important that you tell your health care provider about any blood in your urine, even if it is painless or the blood stops without treatment.  Take over-the-counter and prescription medicines only as told by your health care provider.  Drink enough fluid to keep   your urine clear or pale yellow. This information is not intended to replace advice given to you by your health care provider. Make sure you discuss any questions you have with your health care provider. Document Revised: 09/22/2018 Document Reviewed: 05/31/2016 Elsevier Patient Education  2020 [employer].   Hypertension, Adult Hypertension is another name for high blood pressure. High blood pressure forces your heart to work harder to pump blood. This can cause  problems over time. There are two numbers in a blood pressure reading. There is a top number (systolic) over a bottom number (diastolic). It is best to have a blood pressure that is below 120/80. Healthy choices can help lower your blood pressure, or you may need medicine to help lower it. What are the causes? The cause of this condition is not known. Some conditions may be related to high blood pressure. What increases the risk?  Smoking.  Having type 2 diabetes mellitus, high cholesterol, or both.  Not getting enough exercise or physical activity.  Being overweight.  Having too much fat, sugar, calories, or salt (sodium) in your diet.  Drinking too much alcohol.  Having long-term (chronic) kidney disease.  Having a family history of high blood pressure.  Age. Risk increases with age.  Race. You may be at higher risk if you are African American.  Gender. Men are at higher risk than women before age 14. After age 28, women are at higher risk than men.  Having obstructive sleep apnea.  Stress. What are the signs or symptoms?  High blood pressure may not cause symptoms. Very high blood pressure (hypertensive crisis) may cause: ? Headache. ? Feelings of worry or nervousness (anxiety). ? Shortness of breath. ? Nosebleed. ? A feeling of being sick to your stomach (nausea). ? Throwing up (vomiting). ? Changes in how you see. ? Very bad chest pain. ? Seizures. How is this treated?  This condition is treated by making healthy lifestyle changes, such as: ? Eating healthy foods. ? Exercising more. ? Drinking less alcohol.  Your health care provider may prescribe medicine if lifestyle changes are not enough to get your blood pressure under control, and if: ? Your top number is above 130. ? Your bottom number is above 80.  Your personal target blood pressure may vary. Follow these instructions at home: Eating and drinking   If told, follow the DASH eating plan. To  follow this plan: ? Fill one half of your plate at each meal with fruits and vegetables. ? Fill one fourth of your plate at each meal with whole grains. Whole grains include whole-wheat pasta, brown rice, and whole-grain bread. ? Eat or drink low-fat dairy products, such as skim milk or low-fat yogurt. ? Fill one fourth of your plate at each meal with low-fat (lean) proteins. Low-fat proteins include fish, chicken without skin, eggs, beans, and tofu. ? Avoid fatty meat, cured and processed meat, or chicken with skin. ? Avoid pre-made or processed food.  Eat less than 1,500 mg of salt each day.  Do not drink alcohol if: ? Your doctor tells you not to drink. ? You are pregnant, may be pregnant, or are planning to become pregnant.  If you drink alcohol: ? Limit how much you use to:  0-1 drink a day for women.  0-2 drinks a day for men. ? Be aware of how much alcohol is in your drink. In the [place] one drink equals one 12 oz bottle of beer (355 mL), one 5 oz  glass of wine (148 mL), or one 1 oz glass of hard liquor (44 mL). Lifestyle   Work with your doctor to stay at a healthy weight or to lose weight. Ask your doctor what the best weight is for you.  Get at least 30 minutes of exercise most days of the week. This may include walking, swimming, or biking.  Get at least 30 minutes of exercise that strengthens your muscles (resistance exercise) at least 3 days a week. This may include lifting weights or doing Pilates.  Do not use any products that contain nicotine or tobacco, such as cigarettes, e-cigarettes, and chewing tobacco. If you need help quitting, ask your doctor.  Check your blood pressure at home as told by your doctor.  Keep all follow-up visits as told by your doctor. This is important. Medicines  Take over-the-counter and prescription medicines only as told by your doctor. Follow directions carefully.  Do not skip doses of blood pressure medicine. The medicine does not  work as well if you skip doses. Skipping doses also puts you at risk for problems.  Ask your doctor about side effects or reactions to medicines that you should watch for. Contact a doctor if you:  Think you are having a reaction to the medicine you are taking.  Have headaches that keep coming back (recurring).  Feel dizzy.  Have swelling in your ankles.  Have trouble with your vision. Get help right away if you:  Get a very bad headache.  Start to feel mixed up (confused).  Feel weak or numb.  Feel faint.  Have very bad pain in your: ? Chest. ? Belly (abdomen).  Throw up more than once.  Have trouble breathing. Summary  Hypertension is another name for high blood pressure.  High blood pressure forces your heart to work harder to pump blood.  For most people, a normal blood pressure is less than 120/80.  Making healthy choices can help lower blood pressure. If your blood pressure does not get lower with healthy choices, you may need to take medicine. This information is not intended to replace advice given to you by your health care provider. Make sure you discuss any questions you have with your health care provider. Document Revised: 01/06/2018 Document Reviewed: 01/06/2018 Elsevier Patient Education  2020 [employer].

## 2019-06-06 NOTE — Progress Notes (Signed)
Acute Office Visit  Subjective:    Patient ID: Raymond [name], male    DOB: November 14, 1985, 34 y.o.   MRN: [id number]  Chief Complaint  Patient presents with  . Hematuria    HPI Patient is in today for hematuria that he first noticed this morning around 0700.  He works night shift and worked last night, he noticed when he used the restroom this morning that his urine appeared bloody.  He reports urethral pain and describes it as a "pulsing" sensation that just started this morning.  He also reports ports of feeling of incomplete emptying and urgency.  He has no history of STDs, no new sexual partners, he is in a monogamous relationship for the past 12 years.  He does have a family history of kidney stones with his father and maternal grandfather.  Maternal grandfather also has a history of prostate cancer.  He denies any known injury, infection, abdominal pain, back pain, fever, suprapubic pressure, testicular pain. He was Covid positive at the beginning of the month and feels he could be dehydrated since this time.  He is still experiencing diarrhea and he reports his fluid intake is limited to water and beer.   He is unsure if it is related, but 2 nights ago he had a severe headache with vomiting.  He was able to take Tylenol and this resolved. He is leaving for [place] in 2 days for his brother's bachelor party.  He is concerned about travel at this time. He denies use of NSAIDs, blood thinners, aspirin.  URINARY SYMPTOMS Dysuria: burning Urinary frequency: yes Urgency: yes Small volume voids: yes Symptom severity: 4-5/10 Urinary incontinence: no Foul odor: no Hematuria: yes Abdominal pain: no Back pain: no Suprapubic pain/pressure: no Flank pain: no Fever:  no Vomiting: no Relief with cranberry juice: no Relief with pyridium: not tried Status: better/worse/stable Previous urinary tract infection: no Recurrent urinary tract infection: no Sexual activity: 1 partner x 12 years- no STI  hx History of sexually transmitted disease: no Penile discharge: no Treatments attempted: none   HYPERTENSION Patient reports he has not been taking his blood pressure medicine for approximately the last 6 months.  Reports he ran out of refills and did not request more.  He reports he has been feeling well and denies any symptoms associated with hypertension.  He is interested in restarting this medication today.  Hypertension status: uncontrolled  Satisfied with current treatment? not currently taking  Duration of hypertension: chronic BP monitoring frequency:  not checking BP medication side effects:  not currently taking Medication compliance: not currently taking Previous BP meds: Lisinopril Aspirin: no Recurrent headaches: no Visual changes: no Palpitations: no Dyspnea: no Chest pain: no Lower extremity edema: no Dizzy/lightheaded: no   No past medical history on file.  Past Surgical History:  Procedure Laterality Date  . APPENDECTOMY      Family History  Problem Relation Age of Onset  . Cancer Mother     Social History   Socioeconomic History  . Marital status: Married    Spouse name: Not on file  . Number of children: Not on file  . Years of education: Not on file  . Highest education level: Not on file  Occupational History  . Not on file  Tobacco Use  . Smoking status: Former Games developer  . Smokeless tobacco: Former Neurosurgeon    Types: Snuff    Quit date: 01/10/2018  Substance and Sexual Activity  . Alcohol use: Yes    Alcohol/week:  10.0 standard drinks    Types: 10 Cans of beer per week  . Drug use: No  . Sexual activity: Never  Other Topics Concern  . Not on file  Social History Narrative  . Not on file   Social Determinants of Health   Financial Resource Strain:   . Difficulty of Paying Living Expenses: Not on file  Food Insecurity:   . Worried About Charity fundraiser in the Last Year: Not on file  . Ran Out of Food in the Last Year: Not on file   Transportation Needs:   . Lack of Transportation (Medical): Not on file  . Lack of Transportation (Non-Medical): Not on file  Physical Activity:   . Days of Exercise per Week: Not on file  . Minutes of Exercise per Session: Not on file  Stress:   . Feeling of Stress : Not on file  Social Connections:   . Frequency of Communication with Friends and Family: Not on file  . Frequency of Social Gatherings with Friends and Family: Not on file  . Attends Religious Services: Not on file  . Active Member of Clubs or Organizations: Not on file  . Attends Archivist Meetings: Not on file  . Marital Status: Not on file  Intimate Partner Violence:   . Fear of Current or Ex-Partner: Not on file  . Emotionally Abused: Not on file  . Physically Abused: Not on file  . Sexually Abused: Not on file    Outpatient Medications Prior to Visit  Medication Sig Dispense Refill  . lisinopril (PRINIVIL,ZESTRIL) 10 MG tablet Take 1 tablet (10 mg total) by mouth daily. 30 tablet 3   No facility-administered medications prior to visit.    No Known Allergies  Review of Systems  Constitutional: Negative for appetite change, chills, fatigue and fever.  Respiratory: Negative for shortness of breath.   Cardiovascular: Negative for chest pain.  Gastrointestinal: Positive for diarrhea. Negative for abdominal distention, abdominal pain, blood in stool, constipation, nausea and vomiting.  Genitourinary: Positive for decreased urine volume, dysuria, frequency, hematuria and urgency. Negative for difficulty urinating, discharge, flank pain, genital sores, penile pain, penile swelling, scrotal swelling and testicular pain.  Musculoskeletal: Negative for arthralgias, back pain and myalgias.  Skin: Negative for pallor.  Neurological: Negative for dizziness, light-headedness and headaches.  Hematological: Does not bruise/bleed easily.       Objective:    Physical Exam Vitals reviewed.  Constitutional:       Appearance: Normal appearance.  Cardiovascular:     Rate and Rhythm: Normal rate and regular rhythm.     Pulses: Normal pulses.     Heart sounds: Normal heart sounds.  Pulmonary:     Effort: Pulmonary effort is normal.     Breath sounds: Normal breath sounds.  Abdominal:     General: Bowel sounds are normal. There is no distension or abdominal bruit.     Palpations: Abdomen is soft.     Tenderness: There is no abdominal tenderness. There is no right CVA tenderness, left CVA tenderness, guarding or rebound.  Musculoskeletal:     Right lower leg: No edema.     Left lower leg: No edema.  Skin:    General: Skin is warm and dry.     Capillary Refill: Capillary refill takes less than 2 seconds.  Neurological:     General: No focal deficit present.     Mental Status: He is oriented to person, place, and time.  Psychiatric:  Mood and Affect: Mood normal.        Behavior: Behavior normal.     There were no vitals taken for this visit. Wt Readings from Last 3 Encounters:  07/02/18 269 lb (122 kg)  09/30/17 266 lb (120.7 kg)  11/17/16 220 lb (99.8 kg)    Health Maintenance Due  Topic Date Due  . INFLUENZA VACCINE  12/11/2018    There are no preventive care reminders to display for this patient.   Lab Results  Component Value Date   TSH 0.97 12/14/2015   Lab Results  Component Value Date   WBC 6.6 09/30/2017   HGB 16.3 09/30/2017   HCT 46.4 09/30/2017   MCV 89.7 09/30/2017   PLT 181 09/30/2017   Lab Results  Component Value Date   NA 138 09/30/2017   K 4.3 09/30/2017   CO2 27 09/30/2017   GLUCOSE 85 09/30/2017   BUN 7 09/30/2017   CREATININE 0.93 09/30/2017   BILITOT 0.7 09/30/2017   ALKPHOS 51 12/14/2015   AST 21 09/30/2017   ALT 24 09/30/2017   PROT 6.6 09/30/2017   ALBUMIN 4.1 12/14/2015   CALCIUM 9.2 09/30/2017   Lab Results  Component Value Date   CHOL 193 09/30/2017   Lab Results  Component Value Date   HDL 69 09/30/2017   Lab Results   Component Value Date   LDLCALC 103 (H) 09/30/2017   Lab Results  Component Value Date   TRIG 111 09/30/2017   Lab Results  Component Value Date   CHOLHDL 2.8 09/30/2017   Lab Results  Component Value Date   HGBA1C 5.4 12/14/2015       Assessment & Plan:   1. Hematuria, unspecified type Point-of-care urinalysis unable to provide results due to volume of hematuria.  Urine sent to lab for urinalysis with reflex culture. I will also check for gonorrhea/chlamydia to rule out that possibility.  Given family history of prostate cancer will check a PSA today.  CMP with GFR and CBC will also be obtained today to monitor kidney function, anemia, and infection.  Abdominal x-ray to rule out kidney stones or structural abnormality.  Will notify patient of results as soon as they are available and begin antibiotic therapy if required.  - POCT URINALYSIS DIP (CLINITEK) - PSA - CBC with Differential/Platelet - COMPLETE METABOLIC PANEL WITH GFR - DG Abd 2 Views - Urinalysis with Culture Reflex - C. trachomatis/N. gonorrhoeae RNA  2. Essential hypertension Patient's blood pressure was elevated today 141/100.  I feel this is a combination of not taking blood pressure medication and anxiety from current illness.  Lisinopril reordered.  Would like to follow-up with the patient in about 3 months to ensure the effectiveness of this dose.  - lisinopril (ZESTRIL) 10 MG tablet; Take 1 tablet (10 mg total) by mouth daily.  Dispense: 30 tablet; Refill: 3   Tollie Eth, NP

## 2019-06-06 NOTE — Telephone Encounter (Signed)
PT was seen in office 06/06/19. He had no pain during the appointment. A sharp pain arrived after he left. He just called letting us know his pain level is very high. "unbearable"   Please Advise.

## 2019-06-06 NOTE — Progress Notes (Signed)
Called office to notify of severe, 10/10 pain starting in the mid flank on the left side shortly after leaving the office.  The patient reports the pain became increasingly stronger and moved lower through the back.  Patient then reports pain subsided and then returned through the urethra.  He endorses passing 2 small stones.  He is now once again experiencing pain in the left flank, moving downward.  He feels another knee stone may be moving.  Prescription for pain medication and Flomax sent to pharmacy.  Patient instructed to go to the emergency room if the pain intensifies, the medication is not helpful, or if he is unable to pass urine.

## 2019-06-06 NOTE — Telephone Encounter (Signed)
Raymond [name] states as he was driving home and he started to get a sharp stabbing pain in lower left side of back. He reports the pain as a 8/10 on the pain scale. He wanted to know what he can do about the pain. Please advise.

## 2019-06-06 NOTE — Telephone Encounter (Signed)
Raymond [name] has spoke with patient. See other phone message.

## 2019-06-06 NOTE — Telephone Encounter (Signed)
Medication has been prescribed by Les Pou Early, DNP, AGNP-C.

## 2019-06-07 NOTE — Addendum Note (Signed)
Addended by: Corretta Munce, Huntley Dec E on: 06/07/2019 02:19 PM   Modules accepted: Orders

## 2019-06-08 LAB — COMPLETE METABOLIC PANEL WITH GFR
AG Ratio: 1.8 (calc) (ref 1.0–2.5)
ALT: 31 U/L (ref 9–46)
AST: 21 U/L (ref 10–40)
Albumin: 4.1 g/dL (ref 3.6–5.1)
Alkaline phosphatase (APISO): 65 U/L (ref 36–130)
BUN: 11 mg/dL (ref 7–25)
CO2: 25 mmol/L (ref 20–32)
Calcium: 9 mg/dL (ref 8.6–10.3)
Chloride: 105 mmol/L (ref 98–110)
Creat: 0.94 mg/dL (ref 0.60–1.35)
GFR, Est African American: 124 mL/min/{1.73_m2} (ref >=60)
GFR, Est Non African American: 107 mL/min/{1.73_m2} (ref >=60)
Globulin: 2.3 g/dL (calc) (ref 1.9–3.7)
Glucose, Bld: 98 mg/dL (ref 65–99)
Potassium: 3.7 mmol/L (ref 3.5–5.3)
Sodium: 140 mmol/L (ref 135–146)
Total Bilirubin: 0.5 mg/dL (ref 0.2–1.2)
Total Protein: 6.4 g/dL (ref 6.1–8.1)

## 2019-06-08 LAB — CBC WITH DIFFERENTIAL/PLATELET
Absolute Monocytes: 656 cells/uL (ref 200–950)
Basophils Absolute: 89 cells/uL (ref 0–200)
Basophils Relative: 1.1 %
Eosinophils Absolute: 154 cells/uL (ref 15–500)
Eosinophils Relative: 1.9 %
HCT: 44.4 % (ref 38.5–50.0)
Hemoglobin: 15.3 g/dL (ref 13.2–17.1)
Lymphs Abs: 2665 cells/uL (ref 850–3900)
MCH: 33.1 pg — ABNORMAL HIGH (ref 27.0–33.0)
MCHC: 34.5 g/dL (ref 32.0–36.0)
MCV: 96.1 fL (ref 80.0–100.0)
MPV: 10.8 fL (ref 7.5–12.5)
Monocytes Relative: 8.1 %
Neutro Abs: 4536 cells/uL (ref 1500–7800)
Neutrophils Relative %: 56 %
Platelets: 186 10*3/uL (ref 140–400)
RBC: 4.62 10*6/uL (ref 4.20–5.80)
RDW: 13 % (ref 11.0–15.0)
Total Lymphocyte: 32.9 %
WBC: 8.1 10*3/uL (ref 3.8–10.8)

## 2019-06-08 LAB — URINALYSIS W MICROSCOPIC + REFLEX CULTURE
Bacteria, UA: NONE SEEN /HPF
Bilirubin Urine: NEGATIVE
Glucose, UA: NEGATIVE
Hyaline Cast: NONE SEEN /LPF
Ketones, ur: NEGATIVE
Nitrites, Initial: NEGATIVE
RBC / HPF: >=60 /HPF — AB (ref 0–2)
Specific Gravity, Urine: 1.016 (ref 1.001–1.03)
Squamous Epithelial / HPF: NONE SEEN /HPF (ref <=5)
pH: 6 (ref 5.0–8.0)

## 2019-06-08 LAB — URINE CULTURE
MICRO NUMBER:: 10079894
Result:: NO GROWTH
SPECIMEN QUALITY:: ADEQUATE

## 2019-06-08 LAB — CULTURE INDICATED

## 2019-06-21 LAB — URINE CULTURE

## 2019-06-21 LAB — URINALYSIS W MICROSCOPIC + REFLEX CULTURE
Bilirubin Urine: NEGATIVE
Glucose, UA: NEGATIVE
Hyaline Cast: NONE SEEN /LPF
Ketones, ur: NEGATIVE
Nitrites, Initial: POSITIVE — AB
RBC / HPF: >=60 /HPF — AB (ref 0–2)
Specific Gravity, Urine: 1.03 (ref 1.001–1.03)
Squamous Epithelial / HPF: NONE SEEN /HPF (ref <=5)
pH: <=5 (ref 5.0–8.0)

## 2019-06-21 LAB — CBC WITH DIFFERENTIAL/PLATELET

## 2019-06-21 LAB — CULTURE INDICATED

## 2019-06-24 ENCOUNTER — Emergency Department (HOSPITAL_COMMUNITY)
Admission: EM | Admit: 2019-06-24 | Discharge: 2019-06-24 | Disposition: A | Discharge status: Home / Self Care | Payer: No Typology Code available for payment source | Attending: Emergency Medicine | Admitting: Emergency Medicine

## 2019-06-24 ENCOUNTER — Other Ambulatory Visit: Payer: Self-pay

## 2019-06-24 ENCOUNTER — Encounter (HOSPITAL_COMMUNITY): Payer: Self-pay | Admitting: Emergency Medicine

## 2019-06-24 DIAGNOSIS — I10 Essential (primary) hypertension: Secondary | ICD-10-CM | POA: Diagnosis not present

## 2019-06-24 DIAGNOSIS — S0501XA Injury of conjunctiva and corneal abrasion without foreign body, right eye, initial encounter: Secondary | ICD-10-CM | POA: Diagnosis not present

## 2019-06-24 DIAGNOSIS — S0591XA Unspecified injury of right eye and orbit, initial encounter: Secondary | ICD-10-CM | POA: Diagnosis present

## 2019-06-24 DIAGNOSIS — Z79899 Other long term (current) drug therapy: Secondary | ICD-10-CM | POA: Diagnosis not present

## 2019-06-24 DIAGNOSIS — W228XXA Striking against or struck by other objects, initial encounter: Secondary | ICD-10-CM | POA: Diagnosis not present

## 2019-06-24 DIAGNOSIS — Y92821 Forest as the place of occurrence of the external cause: Secondary | ICD-10-CM | POA: Insufficient documentation

## 2019-06-24 DIAGNOSIS — Z87891 Personal history of nicotine dependence: Secondary | ICD-10-CM | POA: Insufficient documentation

## 2019-06-24 DIAGNOSIS — Y9302 Activity, running: Secondary | ICD-10-CM | POA: Insufficient documentation

## 2019-06-24 DIAGNOSIS — Y998 Other external cause status: Secondary | ICD-10-CM | POA: Diagnosis not present

## 2019-06-24 HISTORY — DX: Essential (primary) hypertension: I10

## 2019-06-24 MED ORDER — FLUORESCEIN SODIUM 1 MG OP STRP
1.0000 | ORAL_STRIP | Freq: Once | OPHTHALMIC | Status: DC
  Filled 2019-06-24: qty 1

## 2019-06-24 MED ORDER — MOXIFLOXACIN HCL 0.5 % OP SOLN: 1.0000 [drp] | Freq: Four times a day (QID) | OPHTHALMIC | 0 refills | Status: AC

## 2019-06-24 MED ORDER — PROPARACAINE HCL 0.5 % OP SOLN
2.0000 [drp] | Freq: Once | OPHTHALMIC | Status: DC
  Filled 2019-06-24: qty 15

## 2019-06-24 MED ORDER — ERYTHROMYCIN 5 MG/GM OP OINT: TOPICAL_OINTMENT | Freq: Three times a day (TID) | OPHTHALMIC | 0 refills | Status: AC

## 2019-06-24 NOTE — ED Provider Notes (Addendum)
[facility] EMERGENCY DEPARTMENT Provider Note   CSN: [id number] Arrival date & time: 06/24/19  1830     History Chief Complaint  Patient presents with  . Eye Injury    Raymond [name] is a 34 y.o. male.  Presents to ER after possible eye injury.  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  Branch hit him in the right eye.  Had some pain initially, no longer having any pain.  Does not have foreign body sensation at rest, whenever he closes his eye, feels like there might be something there.  Says he intermittently has been having some blurry vision.  Said that it seems little harder to focus.  Still able to read objects without any difficulty in his right eye.  Does not wear contacts, denies any baseline vision problems.  Denies any additional injuries.  HPI     Past Medical History:  Diagnosis Date  . Hypertension     Patient Active Problem List   Diagnosis Date Noted  . Essential hypertension 07/02/2018  . Palpitation 07/02/2018  . Vitamin D deficiency 12/16/2015  . Obese 11/29/2015    Past Surgical History:  Procedure Laterality Date  . APPENDECTOMY         Family History  Problem Relation Age of Onset  . Cancer Mother   . Kidney Stones Father   . Prostate cancer Maternal Grandfather   . Kidney Stones Maternal Grandfather     Social History   Tobacco Use  . Smoking status: Former Games developer  . Smokeless tobacco: Former Neurosurgeon    Types: Snuff    Quit date: 01/10/2018  Substance Use Topics  . Alcohol use: Yes    Alcohol/week: 10.0 standard drinks    Types: 10 Cans of beer per week  . Drug use: No    Home Medications Prior to Admission medications   Medication Sig Start Date End Date Taking? Authorizing Provider  erythromycin ophthalmic ointment Place into the right eye in the morning, at noon, and at bedtime for 7 days. Place a 1/2 inch ribbon of ointment into the lower eyelid. 06/24/19  07/01/19  Milagros Loll, MD  lisinopril (ZESTRIL) 10 MG tablet Take 1 tablet (10 mg total) by mouth daily. 06/06/19   Tollie Eth, NP  moxifloxacin (VIGAMOX) 0.5 % ophthalmic solution Place 1 drop into the right eye in the morning, at noon, in the evening, and at bedtime for 7 days. 06/24/19 07/01/19  Milagros Loll, MD  oxyCODONE-acetaminophen (PERCOCET/ROXICET) 5-325 MG tablet Take 1-2 tablets by mouth every 6 (six) hours as needed for severe pain. 06/06/19   Tollie Eth, NP  tamsulosin (FLOMAX) 0.4 MG CAPS capsule Take 1 capsule (0.4 mg total) by mouth daily. Until kidney stone is passed. 06/06/19   Tollie Eth, NP    Allergies    Patient has no known allergies.  Review of Systems   Review of Systems  Constitutional: Negative for chills and fever.  HENT: Negative for ear pain and sore throat.   Eyes: Positive for pain and visual disturbance.  Respiratory: Negative for cough and shortness of breath.   Cardiovascular: Negative for chest pain and palpitations.  Gastrointestinal: Negative for abdominal pain and vomiting.  Genitourinary: Negative for dysuria and hematuria.  Musculoskeletal: Negative for arthralgias and back pain.  Skin: Negative for color change and rash.  Neurological: Negative for seizures and syncope.  All other systems reviewed and are negative.  Physical Exam Updated Vital Signs BP 132/80   Pulse 93   Temp 98.8 F (37.1 C) (Oral)   Resp 17   SpO2 98%   Physical Exam Vitals and nursing note reviewed.  Constitutional:      Appearance: He is well-developed.  HENT:     Head: Normocephalic and atraumatic.  Eyes:     General:        Right eye: No discharge.        Left eye: No discharge.     Extraocular Movements: Extraocular movements intact.     Pupils: Pupils are equal, round, and reactive to light.      Comments: No foreign body on cornea on slit lamp exam, no foreign body on close inspection of upper lid and lower lid, pupil is briskly  reactive to light, equal ~34mm; tono pen R eye pressue is 15, left eye is 12  Cardiovascular:     Rate and Rhythm: Normal rate.     Pulses: Normal pulses.  Pulmonary:     Effort: Pulmonary effort is normal. No respiratory distress.  Abdominal:     General: There is no distension.     Palpations: Abdomen is soft.  Musculoskeletal:     Cervical back: Neck supple.  Skin:    General: Skin is warm and dry.  Neurological:     Mental Status: He is alert.     ED Results / Procedures / Treatments   Labs (all labs ordered are listed, but only abnormal results are displayed) Labs Reviewed - No data to display  EKG None  Radiology No results found.  Procedures Procedures (including critical care time)  Medications Ordered in ED Medications  proparacaine (ALCAINE) 0.5 % ophthalmic solution 2 drop (has no administration in time range)  fluorescein ophthalmic strip 1 strip (has no administration in time range)    ED Course  I have reviewed the triage vital signs and the nursing notes.  Pertinent labs & imaging results that were available during my care of the patient were reviewed by me and considered in my medical decision making (see chart for details).    MDM Rules/Calculators/A&P                      34 year old male presents to ER after concern for right thigh injury.  Foreign body sensation.  Reported blurry vision initially but no longer having blurred vision.  Visual acuity normal.  Pressures normal.  Pupils equal and briskly reactive.  Performed fluorescein stain, Woods lamp exam, slit-lamp exam.  Noted small corneal abrasion on fluorescein stain.  No foreign body identified on eye or on upper or lower eyelid eversion.  Discussed with ophthalmology on-call, Dr. Valetta Close.  Recommended fluoroquinolone eyedrops 4 times daily, erythromycin ointment as needed for symptom control.  Recommended follow-up in his clinic tomorrow afternoon.  Discussed plan with patient, in  agreement.    After the discussed management above, the patient was determined to be safe for discharge.  The patient was in agreement with this plan and all questions regarding their care were answered.  ED return precautions were discussed and the patient will return to the ED with any significant worsening of condition.  Final Clinical Impression(s) / ED Diagnoses Final diagnoses:  Abrasion of right cornea, initial encounter    Rx / DC Orders ED Discharge Orders         Ordered    moxifloxacin (VIGAMOX) 0.5 % ophthalmic solution  4 times daily  06/24/19 2117    erythromycin ophthalmic ointment  3 times daily     06/24/19 2117           Milagros Loll, MD 06/24/19 2258    Milagros Loll, MD 06/24/19 2259

## 2019-06-24 NOTE — Discharge Instructions (Signed)
Please go to Dr. Ovidio Kin clinic at 2 PM tomorrow.  He is expecting you.  Please take the moxifloxacin eyedrops as prescribed.  Can use the erythromycin ointment as needed for comfort.  If you develop blurry vision, fever, drainage, other new concerning symptom, return to ER or discussed with ophthalmology.

## 2019-06-24 NOTE — ED Triage Notes (Signed)
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 Pt endorses blurred vision to right eye. Pt reports recent increase in BP meds.

## 2019-06-27 ENCOUNTER — Ambulatory Visit (HOSPITAL_BASED_OUTPATIENT_CLINIC_OR_DEPARTMENT_OTHER): Admission: RE | Admit: 2019-06-27 | Payer: 59 | Source: Ambulatory Visit

## 2020-03-19 ENCOUNTER — Ambulatory Visit (INDEPENDENT_AMBULATORY_CARE_PROVIDER_SITE_OTHER): Payer: 59 | Admitting: Family Medicine

## 2020-03-19 ENCOUNTER — Encounter: Payer: Self-pay | Admitting: Family Medicine

## 2020-03-19 VITALS — BP 139/85 | HR 111 | Temp 98.2°F | Ht 70.28 in | Wt 265.7 lb

## 2020-03-19 DIAGNOSIS — Z13220 Encounter for screening for lipoid disorders: Secondary | ICD-10-CM

## 2020-03-19 DIAGNOSIS — Z0000 Encounter for general adult medical examination without abnormal findings: Secondary | ICD-10-CM | POA: Diagnosis not present

## 2020-03-19 DIAGNOSIS — I10 Essential (primary) hypertension: Secondary | ICD-10-CM | POA: Diagnosis not present

## 2020-03-19 NOTE — Progress Notes (Signed)
Raymond [name] - 34 y.o. male MRN [id number]  Date of birth: 1986/05/03  Subjective Chief Complaint  Patient presents with  . Annual Exam    HPI Raymond [name] is a 34 y.o. male here today for annual exam.  He has a history of HTN and low testosterone.   HTN previously treated with lisinopril.  He stopped after running out of medication.  He has lost some weight and BP readings at home have been well controlled.   He is seeing Delrae Rend MD for management of low testosterone and elevated estrogen levels.  He is also taking several vitamins.  He states he is feeling good with current treatment.   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  Stressful at times, managing well.   He is a non-smoker.   He has a couple of beers each night.    He exercises occasionally.  Working on dietary improvement.   Recently completed COVID vaccine series.  Had flu shot already as well.   Review of Systems  Constitutional: Negative for chills, fever, malaise/fatigue and weight loss.  HENT: Negative for congestion, ear pain and sore throat.   Eyes: Negative for blurred vision, double vision and pain.  Respiratory: Negative for cough and shortness of breath.   Cardiovascular: Negative for chest pain and palpitations.  Gastrointestinal: Negative for abdominal pain, blood in stool, constipation, heartburn and nausea.  Genitourinary: Negative for dysuria and urgency.  Musculoskeletal: Negative for joint pain and myalgias.  Neurological: Negative for dizziness and headaches.  Endo/Heme/Allergies: Does not bruise/bleed easily.  Psychiatric/Behavioral: Negative for depression. The patient is not nervous/anxious and does not have insomnia.      No Known Allergies  Past Medical History:  Diagnosis Date  . Hypertension     Past Surgical History:  Procedure Laterality Date  . APPENDECTOMY      Social History   Socioeconomic History  . Marital status: Married    Spouse name: Not on file   . Number of children: Not on file  . Years of education: Not on file  . Highest education level: Not on file  Occupational History  . Not on file  Tobacco Use  . Smoking status: Former Games developer  . Smokeless tobacco: Former Neurosurgeon    Types: Snuff    Quit date: 01/10/2018  Vaping Use  . Vaping Use: Never used  Substance and Sexual Activity  . Alcohol use: Yes    Alcohol/week: 10.0 standard drinks    Types: 10 Cans of beer per week  . Drug use: No  . Sexual activity: Yes  Other Topics Concern  . Not on file  Social History Narrative  . Not on file   Social Determinants of Health   Financial Resource Strain:   . Difficulty of Paying Living Expenses: Not on file  Food Insecurity:   . Worried About Programme researcher, broadcasting/film/video in the Last Year: Not on file  . Ran Out of Food in the Last Year: Not on file  Transportation Needs:   . Lack of Transportation (Medical): Not on file  . Lack of Transportation (Non-Medical): Not on file  Physical Activity:   . Days of Exercise per Week: Not on file  . Minutes of Exercise per Session: Not on file  Stress:   . Feeling of Stress : Not on file  Social Connections:   . Frequency of Communication with Friends and Family: Not on file  . Frequency of Social Gatherings with Friends  and Family: Not on file  . Attends Religious Services: Not on file  . Active Member of Clubs or Organizations: Not on file  . Attends Banker Meetings: Not on file  . Marital Status: Not on file    Family History  Problem Relation Age of Onset  . Cancer Mother   . Kidney Stones Father   . Prostate cancer Maternal Grandfather   . Kidney Stones Maternal Grandfather     Health Maintenance  Topic Date Due  . Hepatitis C Screening  Never done  . TETANUS/TDAP  11/28/2025  . INFLUENZA VACCINE  Completed  . COVID-19 Vaccine  Completed  . HIV Screening  Completed      ----------------------------------------------------------------------------------------------------------------------------------------------------------------------------------------------------------------- Physical Exam BP 139/85 (BP Location: Left Arm, Patient Position: Sitting, Cuff Size: Large)   Pulse (!) 111   Temp 98.2 F (36.8 C)   Ht 5' 10.28" (1.785 m)   Wt 265 lb 11.2 oz (120.5 kg)   SpO2 100%   BMI 37.83 kg/m   Physical Exam Constitutional:      General: He is not in acute distress. HENT:     Head: Normocephalic and atraumatic.     Right Ear: Tympanic membrane and external ear normal.     Left Ear: Tympanic membrane and external ear normal.  Eyes:     General: No scleral icterus. Neck:     Thyroid: No thyromegaly.  Cardiovascular:     Rate and Rhythm: Normal rate and regular rhythm.     Heart sounds: Normal heart sounds.  Pulmonary:     Effort: Pulmonary effort is normal.     Breath sounds: Normal breath sounds.  Abdominal:     General: Bowel sounds are normal. There is no distension.     Palpations: Abdomen is soft.     Tenderness: There is no abdominal tenderness. There is no guarding.  Musculoskeletal:     Cervical back: Normal range of motion.  Lymphadenopathy:     Cervical: No cervical adenopathy.  Skin:    General: Skin is warm and dry.     Findings: No rash.  Neurological:     General: No focal deficit present.     Mental Status: He is alert and oriented to person, place, and time.     Cranial Nerves: No cranial nerve deficit.     Motor: No abnormal muscle tone.  Psychiatric:        Mood and Affect: Mood normal.        Behavior: Behavior normal.     ------------------------------------------------------------------------------------------------------------------------------------------------------------------------------------------------------------------- Assessment and Plan  Essential hypertension BP is fairly well controlled off  of medication.  Recommend low sodium diet and continued efforts for weight loss.   Well adult exam Well adult Had labs completed through [facility] MD.  He will upload a copy through MyChart. Immunizations: UTD Screenings: UTD Anticipatory guidance/Risk factor reduction:  Recommendations per AVS.    No orders of the defined types were placed in this encounter.   No follow-ups on file.    This visit occurred during the SARS-CoV-2 public health emergency.  Safety protocols were in place, including screening questions prior to the visit, additional usage of staff PPE, and extensive cleaning of exam room while observing appropriate contact time as indicated for disinfecting solutions.

## 2020-03-19 NOTE — Assessment & Plan Note (Signed)
BP is fairly well controlled off of medication.  Recommend low sodium diet and continued efforts for weight loss.

## 2020-03-19 NOTE — Assessment & Plan Note (Signed)
Well adult Had labs completed through [facility] MD.  He will upload a copy through MyChart. Immunizations: UTD Screenings: UTD Anticipatory guidance/Risk factor reduction:  Recommendations per AVS.

## 2020-03-19 NOTE — Patient Instructions (Signed)
Preventive Care 19-33 Years Old, Male Preventive care refers to lifestyle choices and visits with your health care provider that can promote health and wellness. This includes:  A yearly physical exam. This is also called an annual well check.  Regular dental and eye exams.  Immunizations.  Screening for certain conditions.  Healthy lifestyle choices, such as eating a healthy diet, getting regular exercise, not using drugs or products that contain nicotine and tobacco, and limiting alcohol use. What can I expect for my preventive care visit? Physical exam Your health care provider will check:  Height and weight. These may be used to calculate body mass index (BMI), which is a measurement that tells if you are at a healthy weight.  Heart rate and blood pressure.  Your skin for abnormal spots. Counseling Your health care provider may ask you questions about:  Alcohol, tobacco, and drug use.  Emotional well-being.  Home and relationship well-being.  Sexual activity.  Eating habits.  Work and work Statistician. What immunizations do I need?  Influenza (flu) vaccine  This is recommended every year. Tetanus, diphtheria, and pertussis (Tdap) vaccine  You may need a Td booster every 10 years. Varicella (chickenpox) vaccine  You may need this vaccine if you have not already been vaccinated. Human papillomavirus (HPV) vaccine  If recommended by your health care provider, you may need three doses over 6 months. Measles, mumps, and rubella (MMR) vaccine  You may need at least one dose of MMR. You may also need a second dose. Meningococcal conjugate (MenACWY) vaccine  One dose is recommended if you are 45-76 years old and a Market researcher living in a residence hall, or if you have one of several medical conditions. You may also need additional booster doses. Pneumococcal conjugate (PCV13) vaccine  You may need this if you have certain conditions and were not  previously vaccinated. Pneumococcal polysaccharide (PPSV23) vaccine  You may need one or two doses if you smoke cigarettes or if you have certain conditions. Hepatitis A vaccine  You may need this if you have certain conditions or if you travel or work in places where you may be exposed to hepatitis A. Hepatitis B vaccine  You may need this if you have certain conditions or if you travel or work in places where you may be exposed to hepatitis B. Haemophilus influenzae type b (Hib) vaccine  You may need this if you have certain risk factors. You may receive vaccines as individual doses or as more than one vaccine together in one shot (combination vaccines). Talk with your health care provider about the risks and benefits of combination vaccines. What tests do I need? Blood tests  Lipid and cholesterol levels. These may be checked every 5 years starting at age 17.  Hepatitis C test.  Hepatitis B test. Screening   Diabetes screening. This is done by checking your blood sugar (glucose) after you have not eaten for a while (fasting).  Sexually transmitted disease (STD) testing. Talk with your health care provider about your test results, treatment options, and if necessary, the need for more tests. Follow these instructions at home: Eating and drinking   Eat a diet that includes fresh fruits and vegetables, whole grains, lean protein, and low-fat dairy products.  Take vitamin and mineral supplements as recommended by your health care provider.  Do not drink alcohol if your health care provider tells you not to drink.  If you drink alcohol: ? Limit how much you have to 0-2  drinks a day. ? Be aware of how much alcohol is in your drink. In the [place] one drink equals one 12 oz bottle of beer (355 mL), one 5 oz glass of wine (148 mL), or one 1 oz glass of hard liquor (44 mL). Lifestyle  Take daily care of your teeth and gums.  Stay active. Exercise for at least 30 minutes on 5 or  more days each week.  Do not use any products that contain nicotine or tobacco, such as cigarettes, e-cigarettes, and chewing tobacco. If you need help quitting, ask your health care provider.  If you are sexually active, practice safe sex. Use a condom or other form of protection to prevent STIs (sexually transmitted infections). What's next?  Go to your health care provider once a year for a well check visit.  Ask your health care provider how often you should have your eyes and teeth checked.  Stay up to date on all vaccines. This information is not intended to replace advice given to you by your health care provider. Make sure you discuss any questions you have with your health care provider. Document Revised: 04/22/2018 Document Reviewed: 04/22/2018 Elsevier Patient Education  2020 [employer].

## 2020-07-06 ENCOUNTER — Ambulatory Visit
Admission: RE | Admit: 2020-07-06 | Discharge: 2020-07-06 | Disposition: A | Discharge status: Home / Self Care | Payer: No Typology Code available for payment source | Source: Ambulatory Visit | Attending: Family Medicine | Admitting: Family Medicine

## 2020-07-06 ENCOUNTER — Other Ambulatory Visit: Payer: Self-pay | Admitting: Family Medicine

## 2020-07-06 DIAGNOSIS — S46911A Strain of unspecified muscle, fascia and tendon at shoulder and upper arm level, right arm, initial encounter: Secondary | ICD-10-CM

## 2020-09-17 ENCOUNTER — Ambulatory Visit: Payer: 59 | Admitting: Family Medicine

## 2021-03-08 ENCOUNTER — Other Ambulatory Visit: Payer: Self-pay

## 2021-03-08 ENCOUNTER — Ambulatory Visit (INDEPENDENT_AMBULATORY_CARE_PROVIDER_SITE_OTHER): Payer: 59 | Admitting: Family Medicine

## 2021-03-08 ENCOUNTER — Ambulatory Visit: Payer: 59

## 2021-03-08 ENCOUNTER — Encounter: Payer: Self-pay | Admitting: Family Medicine

## 2021-03-08 VITALS — BP 133/81 | HR 83 | Temp 97.5°F | Wt 252.5 lb

## 2021-03-08 DIAGNOSIS — R590 Localized enlarged lymph nodes: Secondary | ICD-10-CM

## 2021-03-08 DIAGNOSIS — E291 Testicular hypofunction: Secondary | ICD-10-CM | POA: Insufficient documentation

## 2021-03-08 DIAGNOSIS — R6882 Decreased libido: Secondary | ICD-10-CM | POA: Insufficient documentation

## 2021-03-09 LAB — CBC WITH DIFFERENTIAL/PLATELET
Absolute Monocytes: 626 cells/uL (ref 200–950)
Basophils Absolute: 68 cells/uL (ref 0–200)
Basophils Relative: 1 %
Eosinophils Absolute: 190 cells/uL (ref 15–500)
Eosinophils Relative: 2.8 %
HCT: 51.1 % — ABNORMAL HIGH (ref 38.5–50.0)
Hemoglobin: 17.5 g/dL — ABNORMAL HIGH (ref 13.2–17.1)
Lymphs Abs: 2564 cells/uL (ref 850–3900)
MCH: 33 pg (ref 27.0–33.0)
MCHC: 34.2 g/dL (ref 32.0–36.0)
MCV: 96.2 fL (ref 80.0–100.0)
MPV: 10.6 fL (ref 7.5–12.5)
Monocytes Relative: 9.2 %
Neutro Abs: 3352 cells/uL (ref 1500–7800)
Neutrophils Relative %: 49.3 %
Platelets: 179 10*3/uL (ref 140–400)
RBC: 5.31 10*6/uL (ref 4.20–5.80)
RDW: 12 % (ref 11.0–15.0)
Total Lymphocyte: 37.7 %
WBC: 6.8 10*3/uL (ref 3.8–10.8)

## 2021-03-09 LAB — SEDIMENTATION RATE: Sed Rate: 2 mm/h (ref 0–15)

## 2021-03-09 LAB — C-REACTIVE PROTEIN: CRP: 1.7 mg/L (ref <8.0)

## 2021-03-10 ENCOUNTER — Other Ambulatory Visit: Payer: Self-pay | Admitting: Family Medicine

## 2021-03-10 DIAGNOSIS — I10 Essential (primary) hypertension: Secondary | ICD-10-CM

## 2021-03-10 DIAGNOSIS — R590 Localized enlarged lymph nodes: Secondary | ICD-10-CM | POA: Insufficient documentation

## 2021-03-10 NOTE — Assessment & Plan Note (Signed)
No other swollen lymph nodes noted.  Checking inflammatory markers and CBC today.  Ultrasound of axilla ordered.

## 2021-03-10 NOTE — Progress Notes (Signed)
Raymond [name] - 35 y.o. male MRN [id number]  Date of birth: 01-29-1986  Subjective Chief Complaint  Patient presents with   Mass    HPI Raymond [name] is a 35 year old male here today with complaint of recurrent swelling of his left axillary area.  He has had this over the past few months.  He denies any known injury to this area.  Swelling feels like a knot and is firm in texture.  He does have some tenderness when this occurs.  He does improve spontaneously.  He has not had any new skin lesions.  He has not received any vaccinations recently.  There is a family history of non-Hodgkin's lymphoma in his grandmother.  He denies fever, chills, weight loss or night sweats.  ROS:  A comprehensive ROS was completed and negative except as noted per HPI  No Known Allergies  Past Medical History:  Diagnosis Date   Hypertension     Past Surgical History:  Procedure Laterality Date   APPENDECTOMY      Social History   Socioeconomic History   Marital status: Married    Spouse name: Not on file   Number of children: Not on file   Years of education: Not on file   Highest education level: Not on file  Occupational History   Not on file  Tobacco Use   Smoking status: Former   Smokeless tobacco: Former    Types: Snuff    Quit date: 01/10/2018  Vaping Use   Vaping Use: Never used  Substance and Sexual Activity   Alcohol use: Yes    Alcohol/week: 10.0 standard drinks    Types: 10 Cans of beer per week   Drug use: No   Sexual activity: Yes  Other Topics Concern   Not on file  Social History Narrative   Not on file   Social Determinants of Health   Financial Resource Strain: Not on file  Food Insecurity: Not on file  Transportation Needs: Not on file  Physical Activity: Not on file  Stress: Not on file  Social Connections: Not on file    Family History  Problem Relation Age of Onset   Cancer Mother    Kidney Stones Father    Prostate cancer Maternal Grandfather    Kidney Stones  Maternal Grandfather     Health Maintenance  Topic Date Due   Hepatitis C Screening  Never done   COVID-19 Vaccine (3 - Booster for Moderna series) 05/14/2020   INFLUENZA VACCINE  12/10/2020   TETANUS/TDAP  11/28/2025   HIV Screening  Completed   Pneumococcal Vaccine 52-47 Years old  Aged Out   HPV VACCINES  Aged Out     ----------------------------------------------------------------------------------------------------------------------------------------------------------------------------------------------------------------- Physical Exam BP 133/81 (BP Location: Left Arm, Patient Position: Sitting, Cuff Size: Large)   Pulse 83   Temp (!) 97.5 F (36.4 C)   Wt 252 lb 8 oz (114.5 kg)   SpO2 98%   BMI 35.95 kg/m   Physical Exam Constitutional:      Appearance: Normal appearance.  Eyes:     General: No scleral icterus. Cardiovascular:     Rate and Rhythm: Normal rate and regular rhythm.  Pulmonary:     Effort: Pulmonary effort is normal.     Breath sounds: Normal breath sounds.     Comments: No chest wall tenderness.  No masses palpated along the chest wall.  There is a small nodule in the left axilla that is not tender or swollen. Musculoskeletal:  Cervical back: Neck supple.  Skin:    Comments: No suspicious skin lesions noted on the chest or arm.  Although he does have extensive tattooing of the left arm.  Neurological:     General: No focal deficit present.     Mental Status: He is alert.  Psychiatric:        Mood and Affect: Mood normal.        Behavior: Behavior normal.    ------------------------------------------------------------------------------------------------------------------------------------------------------------------------------------------------------------------- Assessment and Plan  Axillary lymphadenopathy No other swollen lymph nodes noted.  Checking inflammatory markers and CBC today.  Ultrasound of axilla ordered.   No orders of  the defined types were placed in this encounter.   No follow-ups on file.    This visit occurred during the SARS-CoV-2 public health emergency.  Safety protocols were in place, including screening questions prior to the visit, additional usage of staff PPE, and extensive cleaning of exam room while observing appropriate contact time as indicated for disinfecting solutions.

## 2021-03-11 ENCOUNTER — Other Ambulatory Visit: Payer: 59

## 2021-04-07 ENCOUNTER — Emergency Department
Admission: EM | Admit: 2021-04-07 | Discharge: 2021-04-07 | Disposition: A | Discharge status: Home / Self Care | Payer: 59 | Source: Home / Self Care | Attending: Family Medicine | Admitting: Family Medicine

## 2021-04-07 ENCOUNTER — Emergency Department (INDEPENDENT_AMBULATORY_CARE_PROVIDER_SITE_OTHER): Payer: 59

## 2021-04-07 ENCOUNTER — Other Ambulatory Visit: Payer: Self-pay

## 2021-04-07 DIAGNOSIS — M25571 Pain in right ankle and joints of right foot: Secondary | ICD-10-CM

## 2021-04-07 DIAGNOSIS — S93491A Sprain of other ligament of right ankle, initial encounter: Secondary | ICD-10-CM | POA: Diagnosis not present

## 2021-04-07 DIAGNOSIS — M79671 Pain in right foot: Secondary | ICD-10-CM

## 2021-04-07 DIAGNOSIS — S86001A Unspecified injury of right Achilles tendon, initial encounter: Secondary | ICD-10-CM

## 2021-04-07 DIAGNOSIS — S99911A Unspecified injury of right ankle, initial encounter: Secondary | ICD-10-CM

## 2021-04-07 NOTE — ED Triage Notes (Signed)
Pt present right foot injury, pt states he fall in a hole last night and now his right foot is bruised with discoloration.

## 2021-04-07 NOTE — Discharge Instructions (Signed)
Limit walking Use ice and elevation Take ibuprofen for pain Call Dr. Jordan Likes office first thing tomorrow morning.  I feel certain he will see you tomorrow

## 2021-04-08 ENCOUNTER — Ambulatory Visit: Payer: Self-pay

## 2021-04-08 ENCOUNTER — Ambulatory Visit: Payer: 59 | Admitting: Family Medicine

## 2021-04-08 VITALS — BP 138/96 | Ht 70.0 in | Wt 248.0 lb

## 2021-04-08 DIAGNOSIS — S93491A Sprain of other ligament of right ankle, initial encounter: Secondary | ICD-10-CM

## 2021-04-08 NOTE — Assessment & Plan Note (Signed)
Having changes at the syndesmosis and positive compression test.  Normal-appearing Achilles tendon. -Counseled on home exercise therapy and supportive care. -Cam walker. -Could consider physical therapy

## 2021-04-08 NOTE — Patient Instructions (Signed)
Nice to meet you Please try ice  Please try the boot  Please try the range of motion movements   Please send me a message in MyChart with any questions or updates.  Please see me back in 2 weeks.   --Dr. Jordan Likes

## 2021-04-08 NOTE — Progress Notes (Signed)
  Raymond [name] - 35 y.o. male MRN [id number]  Date of birth: 12-07-1985  SUBJECTIVE:  Including CC & ROS.  No chief complaint on file.   Raymond [name] is a 35 y.o. male that is  presenting with acute ankle pain. Pain occurring at the distal tibia and lateral ankle. She tripped while walking back to his truck. Having pain with walking.  Independent review of the right foot and ankle x-ray from 11/27 shows no acute changes.   Review of Systems See HPI   HISTORY: Past Medical, Surgical, Social, and Family History Reviewed & Updated per EMR.   Pertinent Historical Findings include:  Past Medical History:  Diagnosis Date   Hypertension     Past Surgical History:  Procedure Laterality Date   APPENDECTOMY      Family History  Problem Relation Age of Onset   Cancer Mother    Kidney Stones Father    Prostate cancer Maternal Grandfather    Kidney Stones Maternal Grandfather     Social History   Socioeconomic History   Marital status: Married    Spouse name: Not on file   Number of children: Not on file   Years of education: Not on file   Highest education level: Not on file  Occupational History   Not on file  Tobacco Use   Smoking status: Former   Smokeless tobacco: Former    Types: Snuff    Quit date: 01/10/2018  Vaping Use   Vaping Use: Never used  Substance and Sexual Activity   Alcohol use: Yes    Alcohol/week: 10.0 standard drinks    Types: 10 Cans of beer per week   Drug use: No   Sexual activity: Yes  Other Topics Concern   Not on file  Social History Narrative   Not on file   Social Determinants of Health   Financial Resource Strain: Not on file  Food Insecurity: Not on file  Transportation Needs: Not on file  Physical Activity: Not on file  Stress: Not on file  Social Connections: Not on file  Intimate Partner Violence: Not on file     PHYSICAL EXAM:  VS: BP (!) 138/96   Ht 5\' 10"  (1.778 m)   Wt 248 lb (112.5 kg)   BMI 35.58 kg/m  Physical  Exam Gen: NAD, alert, cooperative with exam, well-appearing   Limited ultrasound: Right ankle:  No effusion of the joint. Normal-appearing distal tibia Normal-appearing peroneal tendons. There is increased hyperemia at the syndesmosis to suggest ankle sprain  Summary: Findings consistent with high ankle sprain  Ultrasound and interpretation by , MD     ASSESSMENT & PLAN:   High ankle sprain of right lower extremity Having changes at the syndesmosis and positive compression test.  Normal-appearing Achilles tendon. -Counseled on home exercise therapy and supportive care. -Cam walker. -Could consider physical therapy

## 2021-04-10 NOTE — ED Provider Notes (Signed)
[facility]    CSN: [id number] Arrival date & time: 04/07/21  1110      History   Chief Complaint Chief Complaint  Patient presents with   Foot Injury    HPI Raymond [name] is a 35 y.o. male.   HPI  Patient states he stepped in hole.  He states that his foot stayed stable in his body twisted and fell.  He is here for pain in his right ankle.  It hurts in his Achilles tendon and lateral ankle.  Pain with movement.  Pain with ambulation.  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 Authorizing Provider  anastrozole (ARIMIDEX) 1 MG tablet Take by mouth. 03/15/20   [provider]  Menaquinone-7 (VITAMIN K2 PO) Take by mouth.    [provider]  Testosterone Cypionate 100 MG/ML SOLN Inject 1 mL as directed once a week.    [provider]    Family History Family History  Problem Relation Age of Onset   Cancer Mother    Kidney Stones Father    Prostate cancer Maternal Grandfather    Kidney Stones Maternal Grandfather     Social History Social History   Tobacco Use   Smoking status: Former   Smokeless tobacco: Former    Types: Snuff    Quit date: 01/10/2018  Vaping Use   Vaping Use: Never used  Substance Use Topics   Alcohol use: Yes    Alcohol/week: 10.0 standard drinks    Types: 10 Cans of beer per week   Drug use: No     Allergies   Patient has no known  allergies.   Review of Systems Review of Systems See HPI  Physical Exam Triage Vital Signs ED Triage Vitals  Enc Vitals Group     BP 04/07/21 1237 (!) 138/95     Pulse Rate 04/07/21 1237 73     Resp 04/07/21 1237 18     Temp 04/07/21 1237 98.3 F (36.8 C)     Temp Source 04/07/21 1237 Oral     SpO2 04/07/21 1237 99 %     Weight --      Height --      Head Circumference --      Peak Flow --      Pain Score 04/07/21 1236 4     Pain Loc --      Pain Edu? --      Excl. in GC? --    No data found.  Updated Vital Signs BP (!) 138/95 (BP Location: Left Arm)   Pulse 73   Temp 98.3 F (36.8 C) (Oral)   Resp 18   SpO2 99%      Physical Exam Constitutional:      General: He is not in acute distress.    Appearance: He is well-developed and normal weight.  HENT:     Head: Normocephalic  and atraumatic.  Eyes:     Conjunctiva/sclera: Conjunctivae normal.     Pupils: Pupils are equal, round, and reactive to light.  Cardiovascular:     Rate and Rhythm: Normal rate.  Pulmonary:     Effort: Pulmonary effort is normal. No respiratory distress.  Abdominal:     General: There is no distension.     Palpations: Abdomen is soft.  Musculoskeletal:        General: Normal range of motion.     Cervical back: Normal range of motion.     Comments: Soft tissue swelling of the right lateral ankle.  Tenderness of the ATFL.  Mild tenderness of the distal fibula.  There is mild tenderness of the Achilles tendon with a palpable ridge.  Faint bruising in the area.  Concern for Achilles tendon injury.  Patient can stand on his toes, Achilles tendon remains intact.  I told him I would like him to have sports medicine follow-up  Skin:    General: Skin is warm and dry.  Neurological:     Mental Status: He is alert.     Gait: Gait abnormal.  Psychiatric:        Mood and Affect: Mood normal.        Behavior: Behavior normal.     UC Treatments / Results  Labs (all labs ordered are listed,  but only abnormal results are displayed) Labs Reviewed - No data to display  EKG   Radiology No results found.  Procedures Procedures (including critical care time)  Medications Ordered in UC Medications - No data to display  Initial Impression / Assessment and Plan / UC Course  I have reviewed the triage vital signs and the nursing notes.  Pertinent labs & imaging results that were available during my care of the patient were reviewed by me and considered in my medical decision making (see chart for details).     See sports medicine tomorrow Final Clinical Impressions(s) / UC Diagnoses   Final diagnoses:  Sprain of anterior talofibular ligament of right ankle, initial encounter  Injury of right Achilles tendon, initial encounter     Discharge Instructions      Limit walking Use ice and elevation Take ibuprofen for pain Call Dr. Jordan Likes office first thing tomorrow morning.  I feel certain he will see you tomorrow    ED Prescriptions   None    PDMP not reviewed this encounter.   Eustace Moore, MD 04/10/21 [phone]

## 2021-04-24 ENCOUNTER — Ambulatory Visit: Payer: 59 | Admitting: Family Medicine

## 2021-04-24 ENCOUNTER — Encounter: Payer: Self-pay | Admitting: Family Medicine

## 2021-04-24 VITALS — BP 138/98 | Ht 70.0 in | Wt 248.0 lb

## 2021-04-24 DIAGNOSIS — S93491D Sprain of other ligament of right ankle, subsequent encounter: Secondary | ICD-10-CM | POA: Diagnosis not present

## 2021-04-24 NOTE — Patient Instructions (Signed)
Good to see you Please use ice as needed  Please continue the exercises   Please send me a message in MyChart with any questions or updates.  Please see me back as needed.   --Dr. Kindall Swaby  

## 2021-04-24 NOTE — Assessment & Plan Note (Signed)
Doing well over the past few days.  Has transition out of the cam walker and feels good -Counseled on home exercise therapy and supportive care. -Provided work note. -Could consider physical therapy.

## 2021-04-24 NOTE — Progress Notes (Signed)
°  Raymond [name] - 35 y.o. male MRN [id number]  Date of birth: [birth year]  SUBJECTIVE:  Including CC & ROS.  No chief complaint on file.   Raymond [name] is a 35 y.o. male that is following up for his right ankle pain.  He has slowly transition out of the cam walker.  He feels well and has been doing the home exercises.   Review of Systems See HPI   HISTORY: Past Medical, Surgical, Social, and Family History Reviewed & Updated per EMR.   Pertinent Historical Findings include:  Past Medical History:  Diagnosis Date   Hypertension     Past Surgical History:  Procedure Laterality Date   APPENDECTOMY      Family History  Problem Relation Age of Onset   Cancer Mother    Kidney Stones Father    Prostate cancer Maternal Grandfather    Kidney Stones Maternal Grandfather     Social History   Socioeconomic History   Marital status: Married    Spouse name: Not on file   Number of children: Not on file   Years of education: Not on file   Highest education level: Not on file  Occupational History   Not on file  Tobacco Use   Smoking status: Former   Smokeless tobacco: Former    Types: Snuff    Quit date: 01/10/2018  Vaping Use   Vaping Use: Never used  Substance and Sexual Activity   Alcohol use: Yes    Alcohol/week: 10.0 standard drinks    Types: 10 Cans of beer per week   Drug use: No   Sexual activity: Yes  Other Topics Concern   Not on file  Social History Narrative   Not on file   Social Determinants of Health   Financial Resource Strain: Not on file  Food Insecurity: Not on file  Transportation Needs: Not on file  Physical Activity: Not on file  Stress: Not on file  Social Connections: Not on file  Intimate Partner Violence: Not on file     PHYSICAL EXAM:  VS: BP (!) 138/98 (BP Location: Left Arm, Patient Position: Sitting)    Ht 5\' 10"  (1.778 m)    Wt 248 lb (112.5 kg)    BMI 35.58 kg/m  Physical Exam Gen: NAD, alert, cooperative with exam,  well-appearing    ASSESSMENT & PLAN:   High ankle sprain of right lower extremity Doing well over the past few days.  Has transition out of the cam walker and feels good -Counseled on home exercise therapy and supportive care. -Provided work note. -Could consider physical therapy.

## 2021-05-08 ENCOUNTER — Emergency Department (HOSPITAL_BASED_OUTPATIENT_CLINIC_OR_DEPARTMENT_OTHER): Payer: 59

## 2021-05-08 ENCOUNTER — Emergency Department (HOSPITAL_BASED_OUTPATIENT_CLINIC_OR_DEPARTMENT_OTHER)
Admission: EM | Admit: 2021-05-08 | Discharge: 2021-05-08 | Disposition: A | Discharge status: Home / Self Care | Payer: 59 | Attending: Emergency Medicine | Admitting: Emergency Medicine

## 2021-05-08 ENCOUNTER — Ambulatory Visit: Payer: 59 | Admitting: Physician Assistant

## 2021-05-08 ENCOUNTER — Other Ambulatory Visit: Payer: Self-pay

## 2021-05-08 ENCOUNTER — Encounter (HOSPITAL_BASED_OUTPATIENT_CLINIC_OR_DEPARTMENT_OTHER): Payer: Self-pay

## 2021-05-08 ENCOUNTER — Emergency Department (INDEPENDENT_AMBULATORY_CARE_PROVIDER_SITE_OTHER): Payer: 59

## 2021-05-08 ENCOUNTER — Emergency Department
Admission: EM | Admit: 2021-05-08 | Discharge: 2021-05-08 | Disposition: A | Discharge status: Home / Self Care | Payer: 59 | Source: Home / Self Care | Attending: Emergency Medicine | Admitting: Emergency Medicine

## 2021-05-08 DIAGNOSIS — R319 Hematuria, unspecified: Secondary | ICD-10-CM

## 2021-05-08 DIAGNOSIS — R1012 Left upper quadrant pain: Secondary | ICD-10-CM | POA: Diagnosis not present

## 2021-05-08 DIAGNOSIS — N200 Calculus of kidney: Secondary | ICD-10-CM | POA: Diagnosis not present

## 2021-05-08 DIAGNOSIS — I10 Essential (primary) hypertension: Secondary | ICD-10-CM | POA: Diagnosis not present

## 2021-05-08 DIAGNOSIS — N201 Calculus of ureter: Secondary | ICD-10-CM | POA: Insufficient documentation

## 2021-05-08 DIAGNOSIS — Z87891 Personal history of nicotine dependence: Secondary | ICD-10-CM | POA: Diagnosis not present

## 2021-05-08 LAB — URINALYSIS, ROUTINE W REFLEX MICROSCOPIC
Bilirubin Urine: NEGATIVE
Glucose, UA: NEGATIVE mg/dL
Ketones, ur: NEGATIVE mg/dL
Leukocytes,Ua: NEGATIVE
Nitrite: NEGATIVE
Protein, ur: NEGATIVE mg/dL
Specific Gravity, Urine: 1.025 (ref 1.005–1.030)
pH: 6 (ref 5.0–8.0)

## 2021-05-08 LAB — POCT URINALYSIS DIP (MANUAL ENTRY)
Bilirubin, UA: NEGATIVE
Glucose, UA: NEGATIVE mg/dL
Ketones, POC UA: NEGATIVE mg/dL
Leukocytes, UA: NEGATIVE
Nitrite, UA: NEGATIVE
Protein Ur, POC: NEGATIVE mg/dL
Spec Grav, UA: 1.025 (ref 1.010–1.025)
Urobilinogen, UA: 0.2 E.U./dL
pH, UA: 6.5 (ref 5.0–8.0)

## 2021-05-08 LAB — BASIC METABOLIC PANEL
Anion gap: 10 (ref 5–15)
BUN: 15 mg/dL (ref 6–20)
CO2: 27 mmol/L (ref 22–32)
Calcium: 9.1 mg/dL (ref 8.9–10.3)
Chloride: 98 mmol/L (ref 98–111)
Creatinine, Ser: 1.46 mg/dL — ABNORMAL HIGH (ref 0.61–1.24)
GFR, Estimated: >60 mL/min (ref >60)
Glucose, Bld: 94 mg/dL (ref 70–99)
Potassium: 4.1 mmol/L (ref 3.5–5.1)
Sodium: 135 mmol/L (ref 135–145)

## 2021-05-08 LAB — CBC
HCT: 46.2 % (ref 39.0–52.0)
Hemoglobin: 16.8 g/dL (ref 13.0–17.0)
MCH: 33.1 pg (ref 26.0–34.0)
MCHC: 36.4 g/dL — ABNORMAL HIGH (ref 30.0–36.0)
MCV: 91.1 fL (ref 80.0–100.0)
Platelets: 166 10*3/uL (ref 150–400)
RBC: 5.07 MIL/uL (ref 4.22–5.81)
RDW: 11.7 % (ref 11.5–15.5)
WBC: 10.7 10*3/uL — ABNORMAL HIGH (ref 4.0–10.5)
nRBC: 0 % (ref 0.0–0.2)

## 2021-05-08 LAB — URINALYSIS, MICROSCOPIC (REFLEX): WBC, UA: NONE SEEN WBC/hpf (ref 0–5)

## 2021-05-08 MED ORDER — ACETAMINOPHEN 500 MG PO TABS
1000.0000 mg | ORAL_TABLET | Freq: Once | ORAL | Status: AC
  Administered 2021-05-08: 14:00:00 1000 mg via ORAL

## 2021-05-08 MED ORDER — OXYCODONE HCL 5 MG PO TABS: 5.0000 mg | ORAL_TABLET | ORAL | 0 refills | Status: AC | PRN

## 2021-05-08 MED ORDER — ONDANSETRON 4 MG PO TBDP: 4.0000 mg | ORAL_TABLET | Freq: Three times a day (TID) | ORAL | 0 refills | Status: AC | PRN

## 2021-05-08 MED ORDER — KETOROLAC TROMETHAMINE 30 MG/ML IJ SOLN
30.0000 mg | Freq: Once | INTRAMUSCULAR | Status: AC
  Administered 2021-05-08: 14:00:00 30 mg via INTRAMUSCULAR

## 2021-05-08 NOTE — ED Triage Notes (Signed)
Pt c/o left flank pain started last night-states he was seen at [facility] and advised he has 54mm kidney stone and to come to ED-NAD-steady gait

## 2021-05-08 NOTE — Discharge Instructions (Addendum)
You have a 7 mm kidney stone in your ureter on the left.  I am concerned that it could be an obstructing stone, causing hydronephrosis.  Please go to the emergency department for advanced imaging to rule this out.

## 2021-05-08 NOTE — ED Provider Notes (Signed)
[facility]ENC[facility] DEPARTMENT Provider Note   CSN: [id number] Arrival date & time: 05/08/21  1443     History Chief Complaint  Patient presents with   Nephrolithiasis    Raymond [name] is a 35 y.o. male.  35 year old male with history of elevated blood pressure (not currently on medication), ureteral stone (diagnosed by PCP, has not followed up with urology, past stone without intervention or complication), presents with complaint of left back pain onset 10:00 last night, similar to when he had a prior kidney stone.  Patient reports difficulty voiding at home and hematuria with vomiting.  Denies fevers, chills, dysuria or changes in bowel habits.  Patient reports prior kidney stone, he was diagnosed with PCP and passed stone without intervention, did not follow-up with urology.  After persistent pain without feeling like he was having a movement of his stone, he went to urgent care today where he had a KUB showing a approximately 7 mm stone and was sent to the ER.  Patient was given 30 mg of Toradol IM and 1 g of Tylenol p.o. with improvement in his pain.  Patient has had water today otherwise is n.p.o.      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   Reduced libido 03/08/2021   Essential hypertension 07/02/2018   Palpitation 07/02/2018   Vitamin D deficiency 12/16/2015   Well adult exam 11/29/2015   Obese 11/29/2015    Past Surgical History:  Procedure Laterality Date   APPENDECTOMY         Family History  Problem Relation Age of Onset   Cancer Mother    Kidney Stones Father    Prostate cancer Maternal Grandfather    Kidney Stones Maternal Grandfather     Social History   Tobacco Use   Smoking status: Former    Types: Cigarettes   Smokeless tobacco:  Former    Types: Snuff    Quit date: 01/10/2018  Vaping Use   Vaping Use: Never used  Substance Use Topics   Alcohol use: Yes    Alcohol/week: 10.0 standard drinks    Types: 10 Cans of beer per week    Comment: occ   Drug use: No    Home Medications Prior to Admission medications   Medication Sig Start Date End Date Taking? Authorizing Provider  ondansetron (ZOFRAN-ODT) 4 MG disintegrating tablet Take 1 tablet (4 mg total) by mouth every 8 (eight) hours as needed for nausea or vomiting. 05/08/21  Yes Tacy Learn, PA-C  oxyCODONE (ROXICODONE) 5 MG immediate release tablet Take 1 tablet (5 mg total) by mouth every 4 (four) hours as needed for severe pain. 05/08/21  Yes Tacy Learn, PA-C  anastrozole (ARIMIDEX) 1 MG tablet Take by mouth. 03/15/20   [provider]  Menaquinone-7 (VITAMIN K2 PO) Take by mouth.    [provider]  Testosterone Cypionate 100 MG/ML SOLN Inject 1 mL as directed once a week.    [provider]    Allergies    Patient has no known allergies.  Review of Systems   Review of Systems  Constitutional:  Negative for chills and fever.  Respiratory:  Negative for shortness of breath.   Cardiovascular:  Negative for chest pain.  Gastrointestinal:  Positive for nausea and vomiting. Negative  for abdominal pain, constipation and diarrhea.  Genitourinary:  Positive for difficulty urinating, flank pain and hematuria. Negative for dysuria and urgency.  Musculoskeletal:  Positive for back pain. Negative for arthralgias and myalgias.  Skin:  Negative for rash and wound.  Allergic/Immunologic: Negative for immunocompromised state.  Neurological:  Negative for weakness and numbness.  Hematological:  Does not bruise/bleed easily.  All other systems reviewed and are negative.  Physical Exam Updated Vital Signs BP (!) 135/95 (BP Location: Right Arm)    Pulse 85    Temp 98.1 F (36.7 C) (Oral)    Resp 16    SpO2 100%   Physical  Exam Vitals and nursing note reviewed.  Constitutional:      General: He is not in acute distress.    Appearance: He is well-developed. He is not diaphoretic.  HENT:     Head: Normocephalic and atraumatic.  Cardiovascular:     Rate and Rhythm: Normal rate and regular rhythm.     Heart sounds: Normal heart sounds.  Pulmonary:     Effort: Pulmonary effort is normal.     Breath sounds: Normal breath sounds.  Abdominal:     Palpations: Abdomen is soft.     Tenderness: There is no abdominal tenderness. There is no right CVA tenderness or left CVA tenderness.  Skin:    General: Skin is warm and dry.     Findings: No erythema or rash.  Neurological:     Mental Status: He is alert and oriented to person, place, and time.  Psychiatric:        Behavior: Behavior normal.    ED Results / Procedures / Treatments   Labs (all labs ordered are listed, but only abnormal results are displayed) Labs Reviewed  URINALYSIS, ROUTINE W REFLEX MICROSCOPIC - Abnormal; Notable for the following components:      Result Value   Hgb urine dipstick MODERATE (*)    All other components within normal limits  CBC - Abnormal; Notable for the following components:   WBC 10.7 (*)    MCHC 36.4 (*)    All other components within normal limits  BASIC METABOLIC PANEL - Abnormal; Notable for the following components:   Creatinine, Ser 1.46 (*)    All other components within normal limits  URINALYSIS, MICROSCOPIC (REFLEX) - Abnormal; Notable for the following components:   Bacteria, UA RARE (*)    All other components within normal limits    EKG None  Radiology DG Abdomen 1 View  Result Date: 05/08/2021 CLINICAL DATA:  Left flank pain EXAM: ABDOMEN - 1 VIEW COMPARISON:  KUB 06/06/2019 FINDINGS: Bowel-gas pattern is unremarkable. There is a 7 mm calcific density on the left at the level of L2, region of the proximal left ureter. No additional calculi identified. Stable surgical changes in the right lower  abdomen. IMPRESSION: 7 mm calcific density on the left at the level of L2, likely a ureteral calculus. Electronically Signed   By: Ofilia Neas M.D.   On: 05/08/2021 14:16   CT Renal Stone Study  Result Date: 05/08/2021 CLINICAL DATA:  Flank pain renal stones EXAM: CT ABDOMEN AND PELVIS WITHOUT CONTRAST TECHNIQUE: Multidetector CT imaging of the abdomen and pelvis was performed following the standard protocol without IV contrast. COMPARISON:  None. FINDINGS: Lower chest: Unremarkable. Hepatobiliary: No focal abnormality is seen in the liver. There is no dilation of bile ducts. There is subtle increased density in the gallbladder lumen. This may be due to incomplete distention or  suggest presence sludge. There is no wall thickening in the gallbladder. There is no fluid around the gallbladder. Pancreas: No significant focal abnormality is seen. Spleen: Unremarkable. Adrenals/Urinary Tract: Adrenals are not enlarged. There is mild to moderate left hydronephrosis. There is 9 mm calculus in the proximal left ureter close to the ureteropelvic junction. There is mild stranding in the left perinephric fat. There is 2 mm calcific density in the lower pole of left kidney. Urinary bladder is unremarkable. Stomach/Bowel: Stomach is not distended. Small bowel loops are not dilated. Appendix is not seen. There is no pericecal inflammation. There is no significant focal wall thickening in the colon. There is subtle decreased density in the submucosal region in the colon. This may be normal variation due to incomplete distention or suggest chronic nonspecific inflammation. There is no pericolic stranding or fluid collection. Vascular/Lymphatic: Unremarkable. Reproductive: Unremarkable. Other: There is no ascites or pneumoperitoneum. Small bilateral inguinal hernias containing fat are seen. Small umbilical hernia containing fat is seen. Musculoskeletal: Unremarkable. IMPRESSION: There is 9 mm calculus in the proximal left  ureter close to the ureteropelvic junction causing mild to moderate left hydronephrosis. There is 2 mm calculus in the lower pole of left kidney. There is mild stranding in the left perinephric fat which may be related to ureteric obstruction or superimposed infectious process. There is no evidence of intestinal obstruction or pneumoperitoneum. Other findings as described in the body of the report. Electronically Signed   By: Ernie Avena M.D.   On: 05/08/2021 16:47    Procedures Procedures   Medications Ordered in ED Medications - No data to display  ED Course  I have reviewed the triage vital signs and the nursing notes.  Pertinent labs & imaging results that were available during my care of the patient were reviewed by me and considered in my medical decision making (see chart for details).  Clinical Course as of 05/08/21 Jeannine Kitten May 08, 2021  4027 35 year old male with complaint of left side back pain with vomiting and hematuria.  Patient was seen in urgent care today, concern for large proximal stone on KUB, given IM Toradol, Tylenol, sent to the ER. Patient presents with improvement in overall pain control, no CVA tenderness, abdomen is soft and nontender.  Patient's blood pressure is elevated at 146/106, states that he has been told he has elevated blood pressure but is not on medication for this currently.  CBC with mild leukocytosis white count of 10.7, possibly secondary to his vomiting.  He is afebrile. Urinalysis with moderate hemoglobin, negative for leukocytes and nitrates. BMP with elevated creatinine, previously 0.9, currently 1.46. CT shows 9 mm proximal UPJ stone with moderate hydro-. Case discussed with Dr. Laverle Patter with urology, recommends patient call the office tomorrow, possible candidate for lithotripsy however due to the holiday schedule there may not be availability. Discussed pain management with patient, ideally, avoid NSAIDs in anticipation for lithotripsy,  discharged with oxycodone, advised he can add Tylenol to this.  Also given Zofran.  Ultimately if he needs improved pain control from narcotics, he cannot take NSAID.  If pain is not controlled, has ongoing vomiting or develops a fever, patient should return to the emergency room, ideally [facility] for possible stent.  [LM]    Clinical Course User Index [LM] Alden Hipp   MDM Rules/Calculators/A&P  Final Clinical Impression(s) / ED Diagnoses Final diagnoses:  Ureterolithiasis    Rx / DC Orders ED Discharge Orders          Ordered    oxyCODONE (ROXICODONE) 5 MG immediate release tablet  Every 4 hours PRN        05/08/21 1819    ondansetron (ZOFRAN-ODT) 4 MG disintegrating tablet  Every 8 hours PRN        05/08/21 1819             Tacy Learn, PA-C 05/08/21 Harlin Rain, DO 05/09/21 1528

## 2021-05-08 NOTE — ED Provider Notes (Signed)
HPI  SUBJECTIVE:  Raymond [name] is a 35 y.o. male who presents with the acute onset of constant, waxing and waning, dull, throbbing, nonmigratory left back pain "along my kidney" starting last night.  States is identical to previous nephrolithiasis.  He reports vomiting, hematuria, difficulty starting his urinary stream and difficulty completely emptying his bladder.  States that he was dribbling urine earlier today unable to empty his bladder, but this seems to have largely resolved.  He reports low midline abdominal pain this morning.  He states is identical to previous nephrolithiasis.  No nausea, fevers, dysuria, urgency, frequency, cloudy or odorous urine, penile rash, discharge, testicular pain or swelling, pelvic pain.  He tried ibuprofen 400 mg and a hot shower without improvement in his symptoms.  No aggravating factors.  He has a past medical history of nonobstructing nephrolithiasis.  No history of UTI/pyelonephritis.  [id number], Selena Batten, DO   Past Medical History:  Diagnosis Date   Hypertension     Past Surgical History:  Procedure Laterality Date   APPENDECTOMY      Family History  Problem Relation Age of Onset   Cancer Mother    Kidney Stones Father    Prostate cancer Maternal Grandfather    Kidney Stones Maternal Grandfather     Social History   Tobacco Use   Smoking status: Former   Smokeless tobacco: Former    Types: Snuff    Quit date: 01/10/2018  Vaping Use   Vaping Use: Never used  Substance Use Topics   Alcohol use: Yes    Alcohol/week: 10.0 standard drinks    Types: 10 Cans of beer per week    Comment: occ   Drug use: No    No current facility-administered medications for this encounter.  Current Outpatient Medications:    anastrozole (ARIMIDEX) 1 MG tablet, Take by mouth., Disp: , Rfl:    Menaquinone-7 (VITAMIN K2 PO), Take by mouth., Disp: , Rfl:    Testosterone Cypionate 100 MG/ML SOLN, Inject 1 mL as directed once a week., Disp: , Rfl:   No Known  Allergies   ROS  As noted in HPI.   Physical Exam  BP (!) 146/102 (BP Location: Left Arm)    Pulse 100    Temp 98.6 F (37 C) (Oral)    Resp 18    Ht 5\' 10"  (1.778 m)    Wt 112.5 kg    SpO2 98%    BMI 35.58 kg/m   Constitutional: Well developed, well nourished, no acute distress Eyes:  EOMI, conjunctiva normal bilaterally HENT: Normocephalic, atraumatic,mucus membranes moist Respiratory: Normal inspiratory effort Cardiovascular: Normal rate GI: nondistended, soft.  No suprapubic, UVJ, flank tenderness Back: No CVAT skin: No rash, skin intact Musculoskeletal: no deformities Neurologic: Alert & oriented x 3, no focal neuro deficits Psychiatric: Speech and behavior appropriate   ED Course   Medications  acetaminophen (TYLENOL) tablet 1,000 mg (1,000 mg Oral Given 05/08/21 1355)  ketorolac (TORADOL) 30 MG/ML injection 30 mg (30 mg Intramuscular Given 05/08/21 1355)    Orders Placed This Encounter  Procedures   Urine Culture    Standing Status:   Standing    Number of Occurrences:   1    Order Specific Question:   Indication    Answer:   Acute gross hematuria   DG Abdomen 1 View    Standing Status:   Standing    Number of Occurrences:   1    Order Specific Question:   Reason for Exam (SYMPTOM  OR DIAGNOSIS REQUIRED)    Answer:   Left back pain, hematuria rule out nephrolithiasis   POCT urinalysis dipstick    Standing Status:   Standing    Number of Occurrences:   1    Results for orders placed or performed during the hospital encounter of 05/08/21 (from the past 24 hour(s))  POCT urinalysis dipstick     Status: Abnormal   Collection Time: 05/08/21 12:15 PM  Result Value Ref Range   Color, UA yellow yellow   Clarity, UA clear clear   Glucose, UA negative negative mg/dL   Bilirubin, UA negative negative   Ketones, POC UA negative negative mg/dL   Spec Grav, UA 1.025 1.010 - 1.025   Blood, UA trace-intact (A) negative   pH, UA 6.5 5.0 - 8.0   Protein Ur, POC  negative negative mg/dL   Urobilinogen, UA 0.2 0.2 or 1.0 E.U./dL   Nitrite, UA Negative Negative   Leukocytes, UA Negative Negative   DG Abdomen 1 View  Result Date: 05/08/2021 CLINICAL DATA:  Left flank pain EXAM: ABDOMEN - 1 VIEW COMPARISON:  KUB 06/06/2019 FINDINGS: Bowel-gas pattern is unremarkable. There is a 7 mm calcific density on the left at the level of L2, region of the proximal left ureter. No additional calculi identified. Stable surgical changes in the right lower abdomen. IMPRESSION: 7 mm calcific density on the left at the level of L2, likely a ureteral calculus. Electronically Signed   By: Ofilia Neas M.D.   On: 05/08/2021 14:16    ED Clinical Impression  1. Nephrolithiasis   2. Hematuria, unspecified type      ED Assessment/Plan  Patient has trace hematuria, back pain that is identical to previous nephrolithiasis.  He has no palpable bladder, suprapubic tenderness, no evidence of urinary retention or urethral obstruction at this time although I suspect he had a degree of that earlier today.  Patient states this is identical to previous nephrolithiasis.  Will check KUB to rule out large stone.  Discussed that he may need to go to the emergency department for advanced imaging if I find a large stone.  Giving 30 of Toradol and 1000 mg of Tylenol here.  Reviewed imaging independently.  7 mm radiopaque stone at L2.  See radiology report for full details.  Patient with a 7 mm stone on KUB.  Unsure if he is has hydronephrosis or if this is an obstructing stone.  Fortunately, there does not appear to be any signs of infection.  Sending to the ED for advanced imaging.  Patient states he feels better after Tylenol and Toradol.  Discussed labs, imaging, MDM, rationale for transfer to the emergency department with the patient.  Feel that he is stable to go by private vehicle.  patient agrees with plan.   Meds ordered this encounter  Medications   acetaminophen (TYLENOL)  tablet 1,000 mg   ketorolac (TORADOL) 30 MG/ML injection 30 mg      *This clinic note was created using [employer]. Therefore, there may be occasional mistakes despite careful proofreading.  ?    Melynda Ripple, MD 05/08/21 1429

## 2021-05-08 NOTE — Discharge Instructions (Addendum)
Call urology in the morning to schedule follow-up.  Consider surrounding urology offices as well as they may have alternative lithotripsy availability.  Take oxycodone as needed as prescribed for pain.  Can add Tylenol up to 4 g (max) daily for additional pain control.  If this is not sufficient, you can add 800 mg of ibuprofen.  As discussed, use of NSAIDs (such as ibuprofen, Aleve, Advil, Motrin) may delay lithotripsy treatment.  Take Zofran as needed as prescribed for nausea and vomiting.  Return to the emergency room if pain or vomiting are not controlled with medications provided.  Return to ER at anytime if you develop fever.

## 2021-05-08 NOTE — ED Triage Notes (Signed)
Pt states that he has some lower back pain, blood in urine and urine retention. X1 day   Pt states that he has a history of passing kidney stones.

## 2021-05-09 ENCOUNTER — Encounter (HOSPITAL_COMMUNITY)
Admission: RE | Disposition: A | Discharge status: Home / Self Care | Payer: Self-pay | Source: Ambulatory Visit | Attending: Urology

## 2021-05-09 ENCOUNTER — Ambulatory Visit (HOSPITAL_COMMUNITY)
Admission: RE | Admit: 2021-05-09 | Discharge: 2021-05-09 | Disposition: A | Discharge status: Home / Self Care | Payer: 59 | Source: Ambulatory Visit | Attending: Urology | Admitting: Urology

## 2021-05-09 ENCOUNTER — Ambulatory Visit (HOSPITAL_COMMUNITY): Payer: 59

## 2021-05-09 ENCOUNTER — Other Ambulatory Visit: Payer: Self-pay | Admitting: Urology

## 2021-05-09 ENCOUNTER — Encounter (HOSPITAL_COMMUNITY): Payer: Self-pay | Admitting: Urology

## 2021-05-09 ENCOUNTER — Ambulatory Visit (HOSPITAL_COMMUNITY): Payer: 59 | Admitting: Certified Registered Nurse Anesthetist

## 2021-05-09 DIAGNOSIS — N201 Calculus of ureter: Secondary | ICD-10-CM | POA: Diagnosis present

## 2021-05-09 HISTORY — PX: CYSTOSCOPY/URETEROSCOPY/HOLMIUM LASER/STENT PLACEMENT: SHX6546

## 2021-05-09 LAB — URINE CULTURE: Culture: NO GROWTH

## 2021-05-09 SURGERY — CYSTOSCOPY/URETEROSCOPY/HOLMIUM LASER/STENT PLACEMENT
Anesthesia: General | Laterality: Left

## 2021-05-09 MED ORDER — FENTANYL CITRATE (PF) 100 MCG/2ML IJ SOLN
INTRAMUSCULAR | Status: DC | PRN
  Administered 2021-05-09: 100 ug via INTRAVENOUS
  Administered 2021-05-09 (×2): 50 ug via INTRAVENOUS

## 2021-05-09 MED ORDER — ONDANSETRON HCL 4 MG/2ML IJ SOLN
INTRAMUSCULAR | Status: DC | PRN
  Administered 2021-05-09: 4 mg via INTRAVENOUS

## 2021-05-09 MED ORDER — OXYCODONE HCL 5 MG PO TABS: 5.0000 mg | ORAL_TABLET | Freq: Once | ORAL | Status: DC | PRN

## 2021-05-09 MED ORDER — ACETAMINOPHEN 10 MG/ML IV SOLN: 1000.0000 mg | Freq: Once | INTRAVENOUS | Status: DC | PRN

## 2021-05-09 MED ORDER — ACETAMINOPHEN 160 MG/5ML PO SOLN: 1000.0000 mg | Freq: Once | ORAL | Status: DC | PRN

## 2021-05-09 MED ORDER — MIDAZOLAM HCL 5 MG/5ML IJ SOLN
INTRAMUSCULAR | Status: DC | PRN
  Administered 2021-05-09: 2 mg via INTRAVENOUS

## 2021-05-09 MED ORDER — CEFAZOLIN SODIUM-DEXTROSE 2-4 GM/100ML-% IV SOLN
INTRAVENOUS | Status: AC
  Filled 2021-05-09: qty 100

## 2021-05-09 MED ORDER — CHLORHEXIDINE GLUCONATE 0.12 % MT SOLN
15.0000 mL | Freq: Once | OROMUCOSAL | Status: AC
  Administered 2021-05-09: 14:00:00 15 mL via OROMUCOSAL

## 2021-05-09 MED ORDER — OXYCODONE HCL 5 MG/5ML PO SOLN: 5.0000 mg | Freq: Once | ORAL | Status: DC | PRN

## 2021-05-09 MED ORDER — DEXAMETHASONE SODIUM PHOSPHATE 10 MG/ML IJ SOLN
INTRAMUSCULAR | Status: AC
  Filled 2021-05-09: qty 1

## 2021-05-09 MED ORDER — IOHEXOL 300 MG/ML  SOLN
INTRAMUSCULAR | Status: DC | PRN
  Administered 2021-05-09: 16:00:00 10 mL via URETHRAL

## 2021-05-09 MED ORDER — MIDAZOLAM HCL 2 MG/2ML IJ SOLN
INTRAMUSCULAR | Status: AC
  Filled 2021-05-09: qty 2

## 2021-05-09 MED ORDER — LIDOCAINE HCL (CARDIAC) PF 100 MG/5ML IV SOSY
PREFILLED_SYRINGE | INTRAVENOUS | Status: DC | PRN
  Administered 2021-05-09: 60 mg via INTRATRACHEAL

## 2021-05-09 MED ORDER — EPHEDRINE SULFATE-NACL 50-0.9 MG/10ML-% IV SOSY
PREFILLED_SYRINGE | INTRAVENOUS | Status: DC | PRN
  Administered 2021-05-09: 10 mg via INTRAVENOUS

## 2021-05-09 MED ORDER — PROPOFOL 10 MG/ML IV BOLUS
INTRAVENOUS | Status: AC
  Filled 2021-05-09: qty 20

## 2021-05-09 MED ORDER — ONDANSETRON HCL 4 MG/2ML IJ SOLN
INTRAMUSCULAR | Status: AC
  Filled 2021-05-09: qty 2

## 2021-05-09 MED ORDER — ACETAMINOPHEN 500 MG PO TABS: 1000.0000 mg | ORAL_TABLET | Freq: Once | ORAL | Status: DC | PRN

## 2021-05-09 MED ORDER — DEXAMETHASONE SODIUM PHOSPHATE 10 MG/ML IJ SOLN
INTRAMUSCULAR | Status: DC | PRN
  Administered 2021-05-09: 5 mg via INTRAVENOUS

## 2021-05-09 MED ORDER — PROPOFOL 10 MG/ML IV BOLUS
INTRAVENOUS | Status: DC | PRN
  Administered 2021-05-09: 200 mg via INTRAVENOUS

## 2021-05-09 MED ORDER — DEXMEDETOMIDINE (PRECEDEX) IN NS 20 MCG/5ML (4 MCG/ML) IV SYRINGE
PREFILLED_SYRINGE | INTRAVENOUS | Status: DC | PRN
  Administered 2021-05-09: 12 ug via INTRAVENOUS

## 2021-05-09 MED ORDER — LIDOCAINE HCL URETHRAL/MUCOSAL 2 % EX GEL
CUTANEOUS | Status: AC
  Filled 2021-05-09: qty 30

## 2021-05-09 MED ORDER — FENTANYL CITRATE (PF) 250 MCG/5ML IJ SOLN
INTRAMUSCULAR | Status: AC
  Filled 2021-05-09: qty 5

## 2021-05-09 MED ORDER — LIDOCAINE 2% (20 MG/ML) 5 ML SYRINGE
INTRAMUSCULAR | Status: DC | PRN
Start: 2021-05-09 — End: 2021-05-09

## 2021-05-09 MED ORDER — LIDOCAINE HCL URETHRAL/MUCOSAL 2 % EX GEL
CUTANEOUS | Status: DC | PRN
  Administered 2021-05-09: 1 via TOPICAL

## 2021-05-09 MED ORDER — FENTANYL CITRATE PF 50 MCG/ML IJ SOSY: 25.0000 ug | PREFILLED_SYRINGE | INTRAMUSCULAR | Status: DC | PRN

## 2021-05-09 MED ORDER — SODIUM CHLORIDE 0.9 % IR SOLN
Status: DC | PRN
  Administered 2021-05-09: 3000 mL via INTRAVESICAL

## 2021-05-09 MED ORDER — LACTATED RINGERS IV SOLN: INTRAVENOUS | Status: DC

## 2021-05-09 MED ORDER — CEFAZOLIN SODIUM-DEXTROSE 2-3 GM-%(50ML) IV SOLR
INTRAVENOUS | Status: DC | PRN
  Administered 2021-05-09: 2 g via INTRAVENOUS

## 2021-05-09 SURGICAL SUPPLY — 27 items
BAG URO CATCHER STRL LF (MISCELLANEOUS) ×3 IMPLANT
BASKET ZERO TIP NITINOL 2.4FR (BASKET) ×2 IMPLANT
CATH URET 5FR 28IN CONE TIP (BALLOONS)
CATH URET 5FR 70CM CONE TIP (BALLOONS) IMPLANT
CATH URETL OPEN END 6FR 70 (CATHETERS) ×3 IMPLANT
CLOTH BEACON ORANGE TIMEOUT ST (SAFETY) ×3 IMPLANT
GLOVE SURG ENC MOIS LTX SZ7.5 (GLOVE) ×3 IMPLANT
GLOVE SURG POLYISO LF SZ6 (GLOVE) ×2 IMPLANT
GLOVE SURG UNDER POLY LF SZ6.5 (GLOVE) ×2 IMPLANT
GOWN STRL REUS W/TWL LRG LVL3 (GOWN DISPOSABLE) ×2 IMPLANT
GOWN STRL REUS W/TWL XL LVL3 (GOWN DISPOSABLE) ×3 IMPLANT
GUIDEWIRE STR DUAL SENSOR (WIRE) ×3 IMPLANT
GUIDEWIRE ZIPWRE .038 STRAIGHT (WIRE) ×2 IMPLANT
IV NS IRRIG 3000ML ARTHROMATIC (IV SOLUTION) ×2 IMPLANT
KIT TURNOVER KIT A (KITS) IMPLANT
LASER FIB FLEXIVA PULSE ID 365 (Laser) IMPLANT
MANIFOLD NEPTUNE II (INSTRUMENTS) ×3 IMPLANT
NS IRRIG 1000ML POUR BTL (IV SOLUTION) ×2 IMPLANT
PACK CYSTO (CUSTOM PROCEDURE TRAY) ×3 IMPLANT
SHEATH NAVIGATOR HD 11/13X28 (SHEATH) IMPLANT
SHEATH NAVIGATOR HD 11/13X36 (SHEATH) ×2 IMPLANT
STENT URET 6FRX26 CONTOUR (STENTS) ×2 IMPLANT
TRACTIP FLEXIVA PULS ID 200XHI (Laser) IMPLANT
TRACTIP FLEXIVA PULSE ID 200 (Laser) ×2
TUBING CONNECTING 10 (TUBING) ×2 IMPLANT
TUBING CONNECTING 10' (TUBING) ×1
TUBING UROLOGY SET (TUBING) ×3 IMPLANT

## 2021-05-09 NOTE — Anesthesia Procedure Notes (Signed)
Procedure Name: LMA Insertion Date/Time: 05/09/2021 4:17 PM Performed by: Kizzie Fantasia, CRNA Pre-anesthesia Checklist: Patient identified, Emergency Drugs available, Suction available, Patient being monitored and Timeout performed Patient Re-evaluated:Patient Re-evaluated prior to induction Oxygen Delivery Method: Circle system utilized Preoxygenation: Pre-oxygenation with 100% oxygen Induction Type: IV induction Ventilation: Mask ventilation without difficulty LMA: LMA inserted LMA Size: 5.0 Number of attempts: 1 Placement Confirmation: positive ETCO2 and breath sounds checked- equal and bilateral Tube secured with: Tape Dental Injury: Teeth and Oropharynx as per pre-operative assessment

## 2021-05-09 NOTE — Interval H&P Note (Signed)
History and Physical Interval Note:  05/09/2021 3:55 PM  Raymond [name]  has presented today for surgery, with the diagnosis of LEFT PROXIMAL URETERAL STONE.  The various methods of treatment have been discussed with the patient and family. After consideration of risks, benefits and other options for treatment, the patient has consented to  Procedure(s): CYSTOSCOPY/URETEROSCOPY/HOLMIUM LASER/STENT PLACEMENT (Left) as a surgical intervention.  The patient's history has been reviewed, patient examined, no change in status, stable for surgery.  I have reviewed the patient's chart and labs. Again discussed rationale and possible need for pre-stent/staged procedure. Questions were answered to the patient's satisfaction.     Jerilee Field

## 2021-05-09 NOTE — Discharge Instructions (Signed)
Removal of the stent-remove the stent on Sunday morning May 12, 2021 by pulling the string as instructed.

## 2021-05-09 NOTE — Anesthesia Preprocedure Evaluation (Signed)
Anesthesia Evaluation  Patient identified by MRN, date of birth, ID band Patient awake    Reviewed: Allergy & Precautions, NPO status , Patient's Chart, lab work & pertinent test results  Airway Mallampati: II  TM Distance: >3 FB Neck ROM: Full    Dental  (+) Dental Advisory Given, Teeth Intact   Pulmonary former smoker,    breath sounds clear to auscultation       Cardiovascular hypertension,  Rhythm:Regular     Neuro/Psych negative neurological ROS  negative psych ROS   GI/Hepatic negative GI ROS, Neg liver ROS,   Endo/Other  negative endocrine ROS  Renal/GU LEFT PROXIMAL URETERAL STONE  negative genitourinary   Musculoskeletal negative musculoskeletal ROS (+)   Abdominal   Peds  Hematology negative hematology ROS (+)   Anesthesia Other Findings   Reproductive/Obstetrics                             Anesthesia Physical Anesthesia Plan  ASA: 2  Anesthesia Plan: General   Post-op Pain Management: Minimal or no pain anticipated   Induction: Intravenous  PONV Risk Score and Plan: 2 and Ondansetron and Dexamethasone  Airway Management Planned: LMA  Additional Equipment: None  Intra-op Plan:   Post-operative Plan: Extubation in OR  Informed Consent: I have reviewed the patients History and Physical, chart, labs and discussed the procedure including the risks, benefits and alternatives for the proposed anesthesia with the patient or authorized representative who has indicated his/her understanding and acceptance.     Dental advisory given  Plan Discussed with: CRNA and Anesthesiologist  Anesthesia Plan Comments:         Anesthesia Quick Evaluation

## 2021-05-09 NOTE — Transfer of Care (Signed)
Immediate Anesthesia Transfer of Care Note  Patient: Raymond [name]  Procedure(s) Performed: CYSTOSCOPY/URETEROSCOPY/HOLMIUM LASER/STENT PLACEMENT (Left)  Patient Location: PACU  Anesthesia Type:General  Level of Consciousness: awake, alert  and oriented  Airway & Oxygen Therapy: Patient Spontanous Breathing and Patient connected to face mask oxygen  Post-op Assessment: Report given to RN and Post -op Vital signs reviewed and stable  Post vital signs: Reviewed and stable  Last Vitals:  Vitals Value Taken Time  BP 144/97 05/09/21 1705  Temp    Pulse 97 05/09/21 1708  Resp 21 05/09/21 1708  SpO2 97 % 05/09/21 1708  Vitals shown include unvalidated device data.  Last Pain:  Vitals:   05/09/21 1338  TempSrc: Oral  PainSc: 0-No pain         Complications: No notable events documented.

## 2021-05-09 NOTE — H&P (Signed)
Office Visit Report     05/09/2021   --------------------------------------------------------------------------------   Raymond [name]  MRN: [id number]  DOB: December 30, 1985, 35 year old Male  SSN:    PRIMARY CARE:    REFERRING:    PROVIDER:  Jerilee Field, M.D.  LOCATION:  [facility] [phone]     --------------------------------------------------------------------------------   CC/HPI: New patient for ureteral stone. He developed left flank pain 05/07/2021 and a CT scan was done on 05/08/2021 which showed a 7 to 8 mm left proximal stone. This was visible on the scout as well as a KUB. Skin to stone distance was about 12 cm. His white count was 10.7, creatinine 1.46, UA with rare bacteria and 11-20 white cells. His creatinine is normal at baseline. Still has left flank pain and hasn't seen a stone pass. No fever or dysuria.   He passed a stone last year. This is his second stone event.   Today, seen for the above. 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     ALLERGIES: None   MEDICATIONS: Percocet  Ondansetron Hcl     GU PSH: None   NON-GU PSH: None   GU PMH: Renal calculus    NON-GU PMH: None   FAMILY HISTORY: Kidney Stones - Grandfather, Father    Notes: 3 sons; 2 daughters    SOCIAL HISTORY: Marital Status: Married Preferred Language: English; Ethnicity: Not Hispanic Or Latino; Race: White Current Smoking Status: Patient has never smoked.   Tobacco Use Assessment Completed: Used Tobacco in last 30 days? Does not use smokeless tobacco. Drinks 3 drinks per day.  Drinks 1 caffeinated drink per day. Patient's occupation Museum/gallery curator.    REVIEW OF SYSTEMS:    GU Review Male:   Patient reports frequent urination, burning/ pain with urination, get up at night to urinate, stream starts and stops, trouble starting your stream, and have to strain to urinate . Patient denies hard to postpone urination, leakage of urine, erection  problems, and penile pain.  Gastrointestinal (Upper):   Patient denies nausea, vomiting, and indigestion/ heartburn.  Gastrointestinal (Lower):   Patient denies diarrhea and constipation.  Constitutional:   Patient denies fever, night sweats, weight loss, and fatigue.  Skin:   Patient denies skin rash/ lesion and itching.  Eyes:   Patient denies blurred vision and double vision.  Ears/ Nose/ Throat:   Patient denies sore throat and sinus problems.  Hematologic/Lymphatic:   Patient denies swollen glands and easy bruising.  Cardiovascular:   Patient denies leg swelling and chest pains.  Respiratory:   Patient denies cough and shortness of breath.  Endocrine:   Patient denies excessive thirst.  Musculoskeletal:   Patient denies back pain and joint pain.  Neurological:   Patient denies headaches and dizziness.  Psychologic:   Patient denies depression and anxiety.   VITAL SIGNS:      05/09/2021 10:27 AM  Weight 248 lb / 112.49 kg  Height 70 in / 177.8 cm  BP 141/92 mmHg  Pulse 76 /min  Temperature 97.5 F / 36.3 C  BMI 35.6 kg/m   MULTI-SYSTEM PHYSICAL EXAMINATION:    Constitutional: Well-nourished. No physical deformities. Normally developed. Good grooming.  Neck: Neck symmetrical, not swollen. Normal tracheal position.  Respiratory: No labored breathing, no use of accessory muscles.   Cardiovascular: Normal temperature, normal extremity pulses, no swelling, no varicosities.  Lymphatic: No enlargement of neck, axillae, groin.  Skin: No paleness, no jaundice, no cyanosis.  No lesion, no ulcer, no rash.  Neurologic / Psychiatric: Oriented to time, oriented to place, oriented to person. No depression, no anxiety, no agitation.  Gastrointestinal: No mass, no tenderness, no rigidity, non obese abdomen.  Eyes: Normal conjunctivae. Normal eyelids.  Ears, Nose, Mouth, and Throat: Left ear no scars, no lesions, no masses. Right ear no scars, no lesions, no masses. Nose no scars, no lesions, no  masses. Normal hearing. Normal lips.  Musculoskeletal: Normal gait and station of head and neck.     Complexity of Data:  Lab Test Review:   BUN/Creatinine  Records Review:   Previous Hospital Records  X-Ray Review: C.T. Abdomen/Pelvis: Reviewed Films. Discussed With Patient. dec 2022     PROCEDURES:          Urinalysis w/Scope Dipstick Dipstick Cont'd Micro  Color: Amber Bilirubin: Neg mg/dL WBC/hpf: 10 - 65/BXU  Appearance: Clear Ketones: Neg mg/dL RBC/hpf: 10 - 38/BFX  Specific Gravity: 1.025 Blood: 2+ ery/uL Bacteria: Few (10-25/hpf)  pH: 5.5 Protein: 1+ mg/dL Cystals: Ca Oxalate  Glucose: Neg mg/dL Urobilinogen: 0.2 mg/dL Casts: NS (Not Seen)    Nitrites: Neg Trichomonas: Not Present    Leukocyte Esterase: Trace leu/uL Mucous: Not Present      Epithelial Cells: 0 - 5/hpf      Yeast: NS (Not Seen)      Sperm: Not Present    ASSESSMENT:      ICD-10 Details  1 GU:   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 All questions answered.  He's due for work this weekend and needs to get back on the job. Will plan for cysto/left URS/HLL and stent today although we discussed he may need a staged procedure/pre-stent.    PLAN:           Orders Labs Urine Culture          Schedule Return Visit/Planned Activity: ASAP - Schedule Surgery          Document Letter(s):  Created for Patient: Clinical Summary    * Signed by Jerilee Field, M.D. on 05/09/21 at 11:51 AM (EST)*      The information contained in this medical record document is considered private and confidential patient information. This information can only be used for the medical diagnosis and/or medical services that are being provided by the patient's selected caregivers. This information can only be distributed outside of the patient's care if the patient  agrees and signs waivers of authorization for this information to be sent to an outside source or route.

## 2021-05-09 NOTE — Anesthesia Postprocedure Evaluation (Signed)
Anesthesia Post Note  Patient: Raymond [name]  Procedure(s) Performed: CYSTOSCOPY/URETEROSCOPY/HOLMIUM LASER/STENT PLACEMENT (Left)     Patient location during evaluation: PACU Anesthesia Type: General Level of consciousness: awake and alert Pain management: pain level controlled Vital Signs Assessment: post-procedure vital signs reviewed and stable Respiratory status: spontaneous breathing, nonlabored ventilation, respiratory function stable and patient connected to nasal cannula oxygen Cardiovascular status: blood pressure returned to baseline and stable Postop Assessment: no apparent nausea or vomiting Anesthetic complications: no   No notable events documented.  Last Vitals:  Vitals:   05/09/21 1715 05/09/21 1721  BP: (!) 152/111 (!) 140/98  Pulse: (!) 102   Resp: 14   Temp:    SpO2: 95%     Last Pain:  Vitals:   05/09/21 1721  TempSrc:   PainSc: 0-No pain                 Muriah Harsha

## 2021-05-09 NOTE — Op Note (Signed)
Preoperative diagnosis: Left ureteral stone Postoperative diagnosis: Left ureteral stone  Procedure: Cystoscopy with left retrograde pyelogram, left ureteroscopy laser lithotripsy, stone basket extraction, left ureteral stent placement  Surgeon: Mena Goes  Anesthesia: General  Indication for procedure: Raymond [name] is a 35 year old male with a 8 to 9 mm left proximal stone.  After considering all options he elected to proceed with left ureteroscopy.  Findings: On cystoscopy the urethra and bladder unremarkable.  No stone or foreign body in the bladder.  Left retrograde pyelogram-this outlined a single ureter single collecting system unit with a filling defect in the left proximal ureter consistent with the stone and proximal hydronephrosis.  On ureteroscopy the stone was located in the left proximal ureter it was dusted and the largest fragment removed with a 0 tip basket.  Description of procedure: After consent was obtained patient brought to the operating room.  After adequate anesthesia he was placed lithotomy position and prepped and draped in the usual sterile fashion.  Timeout performed to confirm the patient and procedure.  The cystoscope was then passed per urethra and the left ureteral orifice cannulated with a 6 [place] open-ended catheter and left retrograde injection of contrast was performed.  I then advanced a sensor wire which buckled at the stone.  I advanced the open-ended catheter into the mid ureter which brace the wire and help to pass it into the upper calyx.  The open-ended catheter was removed.  I then took a ureteral access sheath, medium, and passed the inner cannula to dilate the ureter and this went easily.  I then used the access sheath to place 2 wires to get a second zip wire in place.  I then went adjacent to the sensor wire leaving it as the safety wire and over the zip.  Access sheath went up easily.  A dual channel digital ureteroscope was then passed where the stone was  located.  200 m laser fiber was advanced where the stone was dusted.  2 fragments went proximally and the largest was grasped with a 0 tip basket and removed.  The other fragment was followed up into an upper calyx where it was dusted.  Inspection of the collecting system noted there to be no other stones and no stones were visible on fluoroscopy.  The access sheath was backed out on the cystoscope and the collecting system renal pelvis ureter inspected on the way out again no significant stone fragments were noted and no injury.  The wire was then backloaded on the cystoscope and a 6 x 26 cm stent advanced.  Wire was removed with a good coil seen in the kidney and a good coil in the bladder.  I left a string on the stent.  He is awakened taken recovery room in stable condition.    Complications: None  Blood loss: Minimal  Specimens: Stone fragment office lab  Drains: 6 x 26 cm left ureteral stent with string  Disposition: Patient stable to PACU

## 2021-05-10 ENCOUNTER — Encounter (HOSPITAL_COMMUNITY): Payer: Self-pay | Admitting: Urology

## 2021-12-31 ENCOUNTER — Ambulatory Visit (INDEPENDENT_AMBULATORY_CARE_PROVIDER_SITE_OTHER): Payer: 59

## 2021-12-31 ENCOUNTER — Ambulatory Visit: Payer: 59 | Admitting: Physician Assistant

## 2021-12-31 ENCOUNTER — Ambulatory Visit: Payer: 59 | Admitting: Family Medicine

## 2021-12-31 ENCOUNTER — Encounter: Payer: Self-pay | Admitting: Family Medicine

## 2021-12-31 VITALS — BP 138/89 | HR 97 | Temp 98.7°F | Ht 70.0 in | Wt 221.1 lb

## 2021-12-31 DIAGNOSIS — Z87442 Personal history of urinary calculi: Secondary | ICD-10-CM

## 2021-12-31 DIAGNOSIS — R319 Hematuria, unspecified: Secondary | ICD-10-CM

## 2021-12-31 DIAGNOSIS — N3289 Other specified disorders of bladder: Secondary | ICD-10-CM

## 2021-12-31 DIAGNOSIS — R635 Abnormal weight gain: Secondary | ICD-10-CM | POA: Insufficient documentation

## 2021-12-31 DIAGNOSIS — R109 Unspecified abdominal pain: Secondary | ICD-10-CM

## 2021-12-31 DIAGNOSIS — R350 Frequency of micturition: Secondary | ICD-10-CM

## 2021-12-31 LAB — POCT URINALYSIS DIP (CLINITEK)
Glucose, UA: NEGATIVE mg/dL
Leukocytes, UA: NEGATIVE
Nitrite, UA: NEGATIVE
POC PROTEIN,UA: 30 — AB
Spec Grav, UA: 1.015 (ref 1.010–1.025)
Urobilinogen, UA: 2 E.U./dL — AB
pH, UA: 7 (ref 5.0–8.0)

## 2021-12-31 MED ORDER — SEMAGLUTIDE-WEIGHT MANAGEMENT 1.7 MG/0.75ML ~~LOC~~ SOAJ: 1.7000 mg | SUBCUTANEOUS | 0 refills | Status: AC

## 2021-12-31 MED ORDER — SEMAGLUTIDE-WEIGHT MANAGEMENT 2.4 MG/0.75ML ~~LOC~~ SOAJ: 2.4000 mg | SUBCUTANEOUS | 1 refills | Status: DC

## 2021-12-31 MED ORDER — SEMAGLUTIDE-WEIGHT MANAGEMENT 1 MG/0.5ML ~~LOC~~ SOAJ: 1.0000 mg | SUBCUTANEOUS | 0 refills | Status: AC

## 2021-12-31 MED ORDER — TAMSULOSIN HCL 0.4 MG PO CAPS: 0.4000 mg | ORAL_CAPSULE | Freq: Every day | ORAL | 0 refills | Status: AC

## 2021-12-31 MED ORDER — HYDROCODONE-ACETAMINOPHEN 5-325 MG PO TABS: 1.0000 | ORAL_TABLET | Freq: Four times a day (QID) | ORAL | 0 refills | Status: AC | PRN

## 2021-12-31 NOTE — Patient Instructions (Signed)
We'll be in touch with CT and lab results.  Updated prescriptions for [facility] sent in.

## 2021-12-31 NOTE — Progress Notes (Signed)
Raymond [name] - 36 y.o. male MRN [id number]  Date of birth: 1985-06-07  Subjective Chief Complaint  Patient presents with   Hematuria    HPI Raymond [name] is a 36 year old male here today with complaint of hematuria, feeling of increased pressure with urination as well as urinary frequency.  Symptoms started a few days ago.  He has not noted any pain.  Had similar symptoms when having a kidney stone previously he has not any fever, chills or nausea.  He has tried to increase his fluid intake.  Additionally he is taking [place] for weight management.  Did well with this so far.  Currently at 1.0 mg dose.  Tolerating this fairly well.  ROS:  A comprehensive ROS was completed and negative except as noted per HPI  No Known Allergies  Past Medical History:  Diagnosis Date   Hypertension     Past Surgical History:  Procedure Laterality Date   APPENDECTOMY     CYSTOSCOPY/URETEROSCOPY/HOLMIUM LASER/STENT PLACEMENT Left 05/09/2021   Procedure: CYSTOSCOPY/URETEROSCOPY/HOLMIUM LASER/STENT PLACEMENT;  Surgeon: Jerilee Field, MD;  Location: [facility];  Service: Urology;  Laterality: Left;    Social History   Socioeconomic History   Marital status: Married    Spouse name: Not on file   Number of children: Not on file   Years of education: Not on file   Highest education level: Not on file  Occupational History   Not on file  Tobacco Use   Smoking status: Former    Types: Cigarettes   Smokeless tobacco: Former    Types: Snuff    Quit date: 01/10/2018  Vaping Use   Vaping Use: Never used  Substance and Sexual Activity   Alcohol use: Yes    Alcohol/week: 10.0 standard drinks of alcohol    Types: 10 Cans of beer per week    Comment: occ   Drug use: No   Sexual activity: Yes  Other Topics Concern   Not on file  Social History Narrative   Not on file   Social Determinants of Health   Financial Resource Strain: Not on file  Food Insecurity: Not on file  Transportation Needs: Not on file   Physical Activity: Not on file  Stress: Not on file  Social Connections: Not on file    Family History  Problem Relation Age of Onset   Cancer Mother    Kidney Stones Father    Prostate cancer Maternal Grandfather    Kidney Stones Maternal Grandfather     Health Maintenance  Topic Date Due   INFLUENZA VACCINE  12/10/2021   COVID-19 Vaccine (3 - Moderna series) 02/09/2022 (Originally 05/14/2020)   Hepatitis C Screening  02/09/2022 (Originally 10/12/2004)   TETANUS/TDAP  11/28/2025   HIV Screening  Completed   HPV VACCINES  Aged Out     ----------------------------------------------------------------------------------------------------------------------------------------------------------------------------------------------------------------- Physical Exam BP 138/89 (BP Location: Left Arm, Patient Position: Sitting, Cuff Size: Normal)   Pulse 97   Temp 98.7 F (37.1 C) (Oral)   Ht 5\' 10"  (1.778 m)   Wt 221 lb 1.9 oz (100.3 kg)   SpO2 100%   BMI 31.73 kg/m   Physical Exam Constitutional:      Appearance: Normal appearance.  Cardiovascular:     Rate and Rhythm: Normal rate and regular rhythm.  Abdominal:     Tenderness: There is no right CVA tenderness or left CVA tenderness.  Neurological:     Mental Status: He is alert.  Psychiatric:        Mood and  Affect: Mood normal.        Behavior: Behavior normal.     ------------------------------------------------------------------------------------------------------------------------------------------------------------------------------------------------------------------- Assessment and Plan  Hematuria Urinalysis and symptoms concerning for stone, especially considering his history.  Stat CT of the abdomen and pelvis with stone protocol ordered.  Also check CMP, CBC and PSA.  Update: CT does show a 3 mm stone at the UVJ.  Adding Flomax as well as Norco as needed if having increased pain.  Abnormal weight gain He has  been using Wegovy.  Currently at 1 mg dose.  He would like to continue this for another month before moving to 1.7 mg.  We will plan to continue this.   Meds ordered this encounter  Medications   Semaglutide-Weight Management 1 MG/0.5ML SOAJ    Sig: Inject 1 mg into the skin once a week for 28 days.    Dispense:  2 mL    Refill:  0   Semaglutide-Weight Management 1.7 MG/0.75ML SOAJ    Sig: Inject 1.7 mg into the skin once a week for 28 days.    Dispense:  3 mL    Refill:  0   Semaglutide-Weight Management 2.4 MG/0.75ML SOAJ    Sig: Inject 2.4 mg into the skin once a week for 28 days.    Dispense:  3 mL    Refill:  1   tamsulosin (FLOMAX) 0.4 MG CAPS capsule    Sig: Take 1 capsule (0.4 mg total) by mouth daily.    Dispense:  30 capsule    Refill:  0   HYDROcodone-acetaminophen (NORCO) 5-325 MG tablet    Sig: Take 1 tablet by mouth every 6 (six) hours as needed for moderate pain.    Dispense:  10 tablet    Refill:  0    No follow-ups on file.    This visit occurred during the SARS-CoV-2 public health emergency.  Safety protocols were in place, including screening questions prior to the visit, additional usage of staff PPE, and extensive cleaning of exam room while observing appropriate contact time as indicated for disinfecting solutions.

## 2021-12-31 NOTE — Assessment & Plan Note (Signed)
He has been using Wegovy.  Currently at 1 mg dose.  He would like to continue this for another month before moving to 1.7 mg.  We will plan to continue this.

## 2021-12-31 NOTE — Assessment & Plan Note (Addendum)
Urinalysis and symptoms concerning for stone, especially considering his history.  Stat CT of the abdomen and pelvis with stone protocol ordered.  Also check CMP, CBC and PSA.  Update: CT does show a 3 mm stone at the UVJ.  Adding Flomax as well as Norco as needed if having increased pain.

## 2022-01-01 LAB — COMPLETE METABOLIC PANEL WITH GFR
AG Ratio: 1.6 (calc) (ref 1.0–2.5)
ALT: 30 U/L (ref 9–46)
AST: 29 U/L (ref 10–40)
Albumin: 4.4 g/dL (ref 3.6–5.1)
Alkaline phosphatase (APISO): 68 U/L (ref 36–130)
BUN: 8 mg/dL (ref 7–25)
CO2: 28 mmol/L (ref 20–32)
Calcium: 9.9 mg/dL (ref 8.6–10.3)
Chloride: 100 mmol/L (ref 98–110)
Creat: 1 mg/dL (ref 0.60–1.26)
Globulin: 2.7 g/dL (calc) (ref 1.9–3.7)
Glucose, Bld: 83 mg/dL (ref 65–99)
Potassium: 5.2 mmol/L (ref 3.5–5.3)
Sodium: 138 mmol/L (ref 135–146)
Total Bilirubin: 1.3 mg/dL — ABNORMAL HIGH (ref 0.2–1.2)
Total Protein: 7.1 g/dL (ref 6.1–8.1)
eGFR: 101 mL/min/{1.73_m2} (ref >=60)

## 2022-01-01 LAB — CBC WITH DIFFERENTIAL/PLATELET
Absolute Monocytes: 711 cells/uL (ref 200–950)
Basophils Absolute: 47 cells/uL (ref 0–200)
Basophils Relative: 0.6 %
Eosinophils Absolute: 111 cells/uL (ref 15–500)
Eosinophils Relative: 1.4 %
HCT: 58.1 % — ABNORMAL HIGH (ref 38.5–50.0)
Hemoglobin: 20 g/dL — ABNORMAL HIGH (ref 13.2–17.1)
Lymphs Abs: 1864 cells/uL (ref 850–3900)
MCH: 35 pg — ABNORMAL HIGH (ref 27.0–33.0)
MCHC: 34.4 g/dL (ref 32.0–36.0)
MCV: 101.6 fL — ABNORMAL HIGH (ref 80.0–100.0)
MPV: 10.4 fL (ref 7.5–12.5)
Monocytes Relative: 9 %
Neutro Abs: 5167 cells/uL (ref 1500–7800)
Neutrophils Relative %: 65.4 %
Platelets: 185 10*3/uL (ref 140–400)
RBC: 5.72 10*6/uL (ref 4.20–5.80)
RDW: 14.5 % (ref 11.0–15.0)
Total Lymphocyte: 23.6 %
WBC: 7.9 10*3/uL (ref 3.8–10.8)

## 2022-01-01 LAB — PSA: PSA: 0.72 ng/mL (ref <=4.00)

## 2022-01-02 LAB — URINALYSIS, ROUTINE W REFLEX MICROSCOPIC
Bacteria, UA: NONE SEEN /HPF
Glucose, UA: NEGATIVE
Hgb urine dipstick: NEGATIVE
Hyaline Cast: NONE SEEN /LPF
Nitrite: NEGATIVE
Specific Gravity, Urine: 1.021 (ref 1.001–1.035)
Squamous Epithelial / HPF: NONE SEEN /HPF (ref <=5)
pH: 7 (ref 5.0–8.0)

## 2022-01-02 LAB — URINE CULTURE
MICRO NUMBER:: 13818413
Result:: NO GROWTH
SPECIMEN QUALITY:: ADEQUATE

## 2022-03-02 ENCOUNTER — Other Ambulatory Visit: Payer: Self-pay | Admitting: Family Medicine

## 2022-03-03 ENCOUNTER — Telehealth: Payer: Self-pay

## 2022-03-03 NOTE — Telephone Encounter (Signed)
Called patietn -needing to know what strength of Wegovy he is currently taking. Left voice mail message to return our call.

## 2022-03-04 NOTE — Telephone Encounter (Signed)
error 

## 2022-03-31 ENCOUNTER — Telehealth: Payer: Self-pay

## 2022-03-31 NOTE — Telephone Encounter (Signed)
Initiated Prior authorization LEX:NTZGYF 2.4MG /0.75ML auto-injectors Via: Covermymeds Case/Key:[id number] Status: approved  as of 03/31/22 Reason:approved through 03/13/2023 Notified Pt via: Mychart

## 2022-04-08 ENCOUNTER — Other Ambulatory Visit: Payer: Self-pay | Admitting: Family Medicine

## 2022-05-09 ENCOUNTER — Ambulatory Visit: Admit: 2022-05-09 | Payer: 59 | Admitting: Urology

## 2022-05-09 SURGERY — CYSTOSCOPY/URETEROSCOPY/HOLMIUM LASER/STENT PLACEMENT
Anesthesia: General | Laterality: Left

## 2022-08-25 ENCOUNTER — Encounter: Payer: Self-pay | Admitting: *Deleted

## 2023-08-13 IMAGING — CT CT RENAL STONE PROTOCOL
2 of 4 series · 16 of 46 positions shown, 18 images · non-contrast
Comparison: None.

CLINICAL DATA: Flank pain renal stones

EXAM:
CT ABDOMEN AND PELVIS WITHOUT CONTRAST
TECHNIQUE: Multidetector CT imaging of the abdomen and pelvis was performed
following the standard protocol without IV contrast.

[Series 2: axial st · axial · 0.98mm/px · z∈[-384,+86]mm · 13 of 104 slices shown, 15 images]
[im 5/104  soft-tissue]
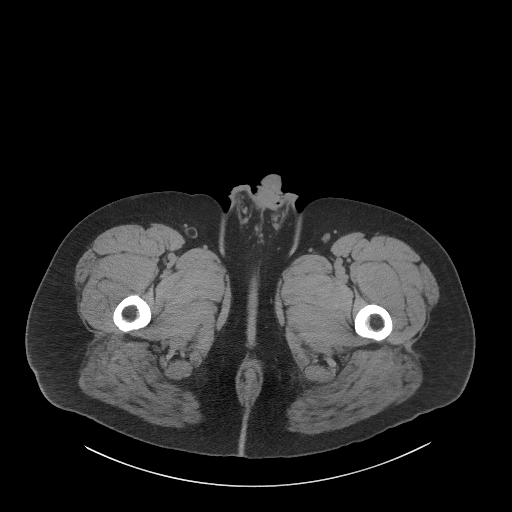
[im 5/104  bone]
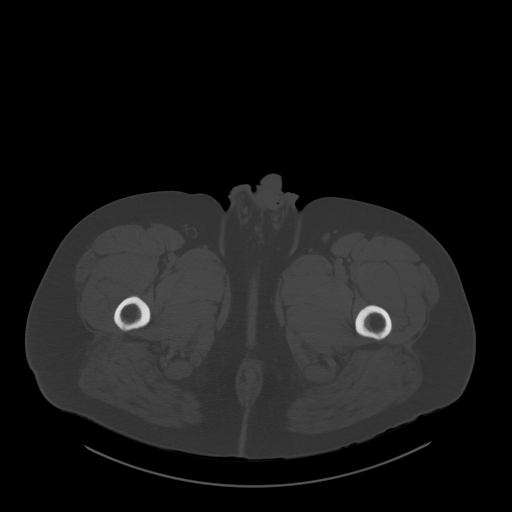
[im 13/104  soft-tissue]
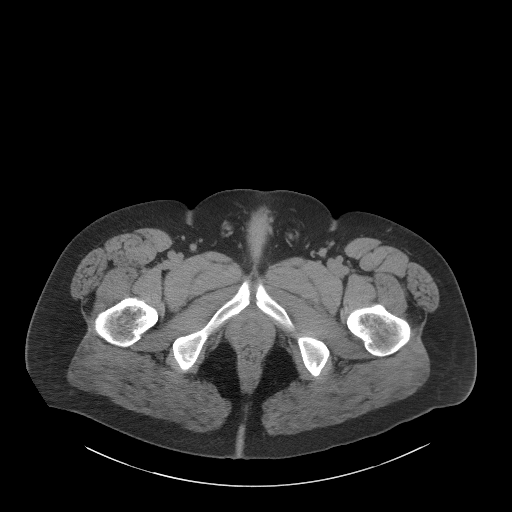
[im 21/104  soft-tissue]
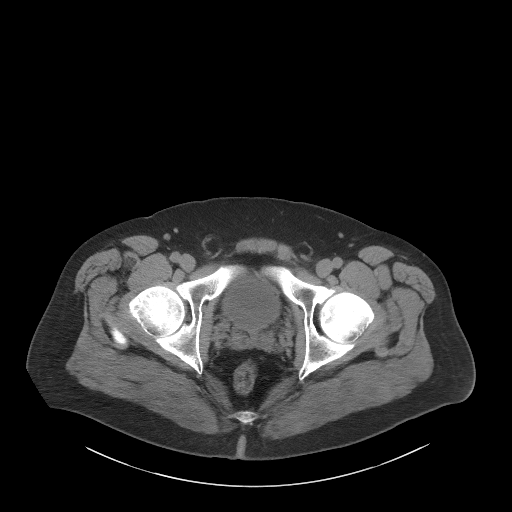
[im 29/104  soft-tissue]
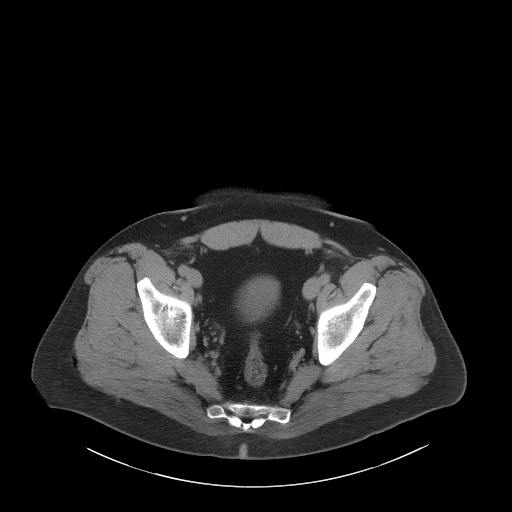
[im 38/104  soft-tissue]
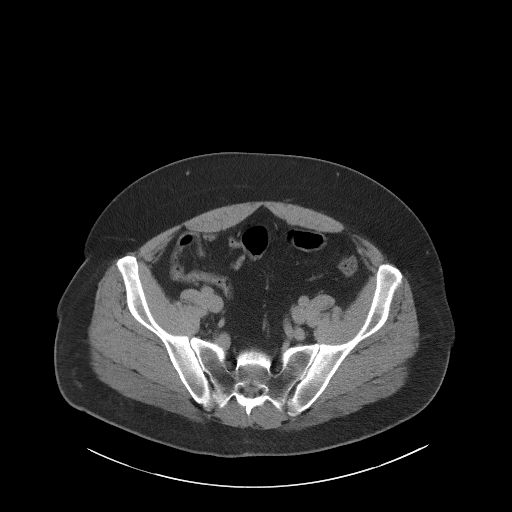
[im 46/104  soft-tissue]
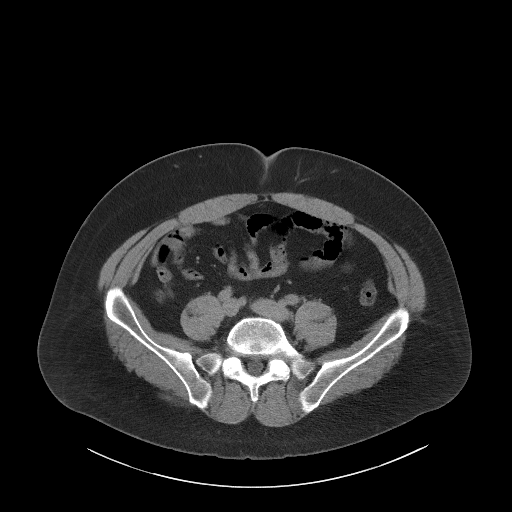
[im 54/104  soft-tissue]
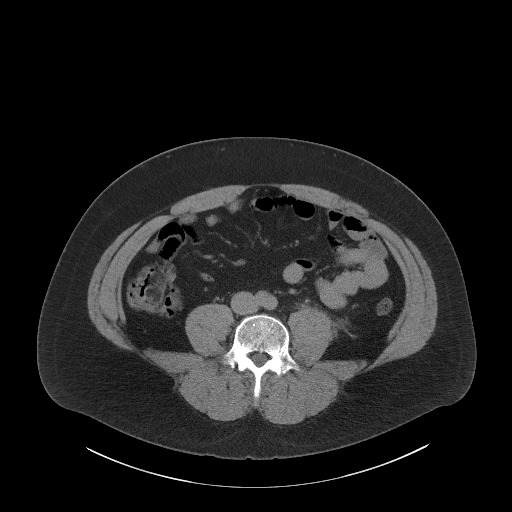
[im 58/104  soft-tissue]
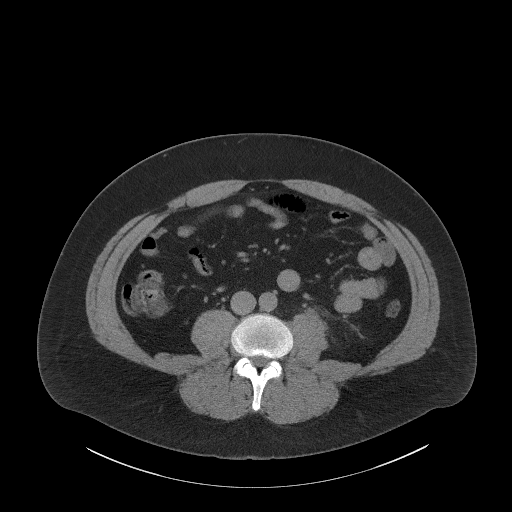
[im 66/104  soft-tissue]
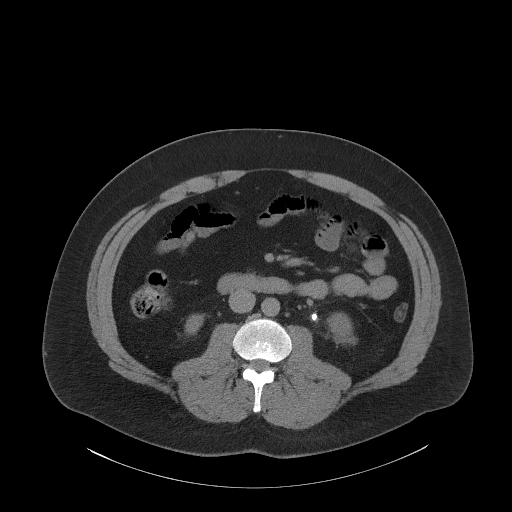
[im 66/104  bone]
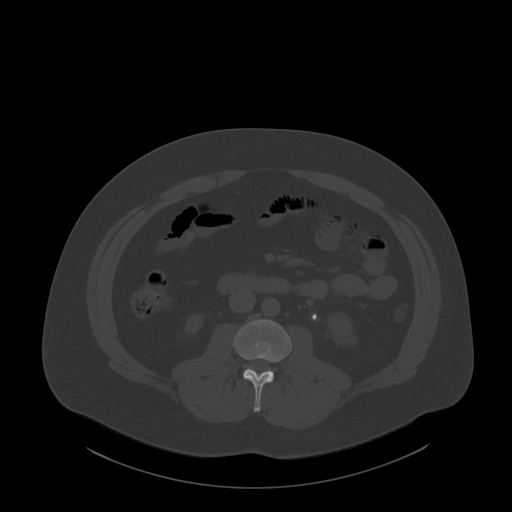
[im 75/104  soft-tissue]
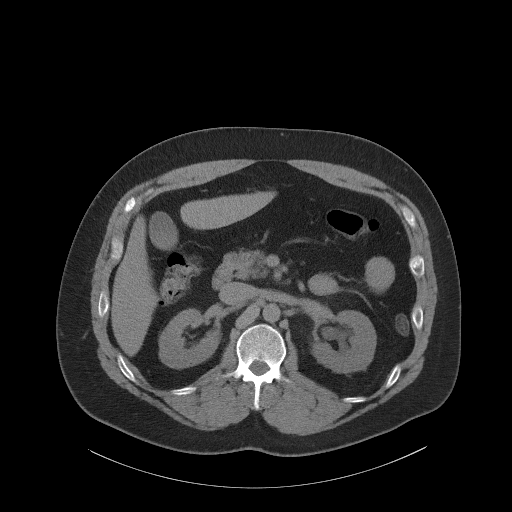
[im 83/104  soft-tissue]
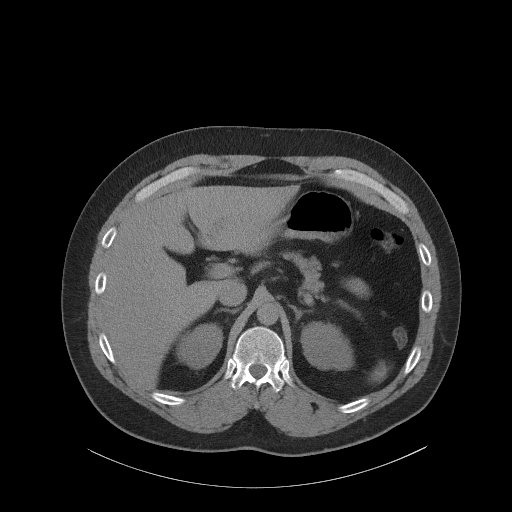
[im 91/104  soft-tissue]
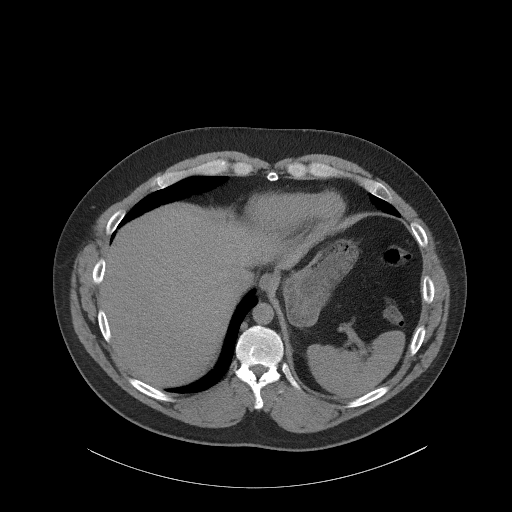
[im 99/104  soft-tissue]
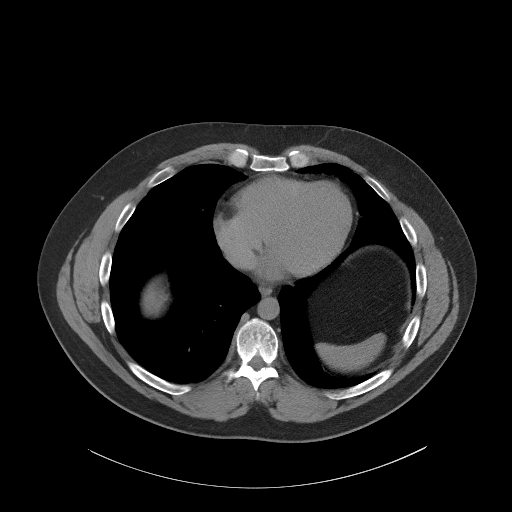

[Series 5: coronal st · coronal · 1.10mm/px · 3 of 130 slices shown]
[im 44/130  soft-tissue]
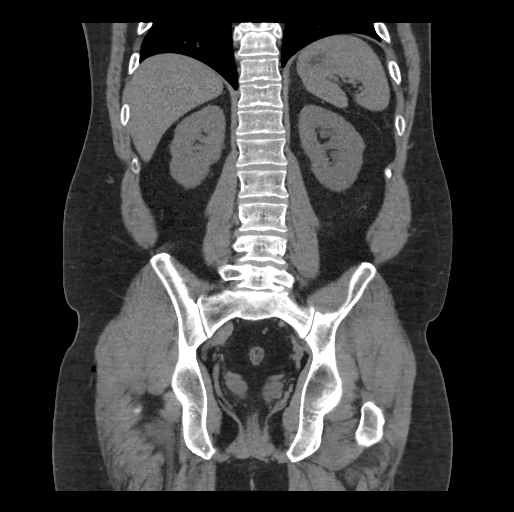
[im 58/130  soft-tissue]
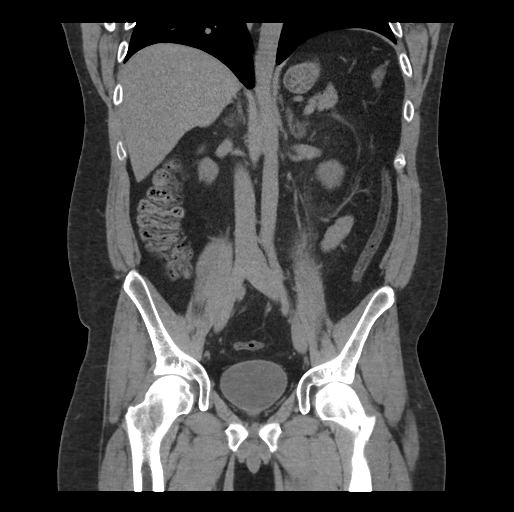
[im 72/130  soft-tissue]
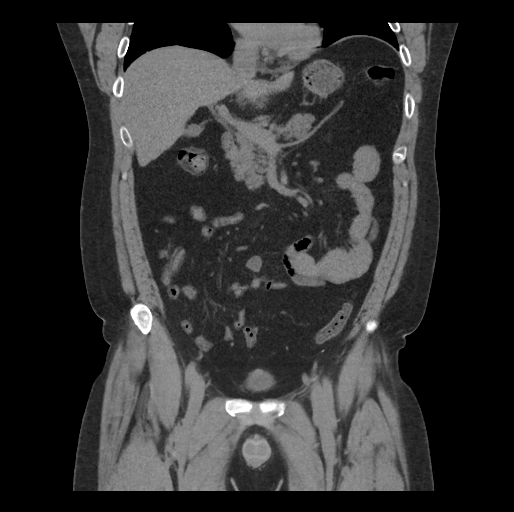

[16 of 46 positions shown; findings below may reference images not displayed]

FINDINGS: Lower chest: Unremarkable.

Hepatobiliary: No focal abnormality is seen in the liver. There is
no dilation of bile ducts. There is subtle increased density in the
gallbladder lumen. This may be due to incomplete distention or
suggest presence sludge. There is no wall thickening in the
gallbladder. There is no fluid around the gallbladder.

Pancreas: No significant focal abnormality is seen.

Spleen: Unremarkable.

Adrenals/Urinary Tract: Adrenals are not enlarged. There is mild to
moderate left hydronephrosis. There is 9 mm calculus in the proximal
left ureter close to the ureteropelvic junction. There is mild
stranding in the left perinephric fat. There is 2 mm calcific
density in the lower pole of left kidney. Urinary bladder is
unremarkable.

Stomach/Bowel: Stomach is not distended. Small bowel loops are not
dilated. Appendix is not seen. There is no pericecal inflammation.
There is no significant focal wall thickening in the colon. There is
subtle decreased density in the submucosal region in the colon. This
may be normal variation due to incomplete distention or suggest
chronic nonspecific inflammation. There is no pericolic stranding or
fluid collection.

Vascular/Lymphatic: Unremarkable.

Reproductive: Unremarkable.

Other: There is no ascites or pneumoperitoneum. Small bilateral
inguinal hernias containing fat are seen. Small umbilical hernia
containing fat is seen.

Musculoskeletal: Unremarkable.
IMPRESSION: There is 9 mm calculus in the proximal left ureter close to the
ureteropelvic junction causing mild to moderate left hydronephrosis.
There is 2 mm calculus in the lower pole of left kidney. There is
mild stranding in the left perinephric fat which may be related to
ureteric obstruction or superimposed infectious process.

There is no evidence of intestinal obstruction or pneumoperitoneum.

Other findings as described in the body of the report.

## 2023-08-13 IMAGING — DX DG ABDOMEN 1V
3 series · 3 of 3 positions shown · non-contrast
Comparison: KUB 06/06/2019

CLINICAL DATA: Left flank pain

EXAM:
ABDOMEN - 1 VIEW

[abdomen kub (1 of 3)]
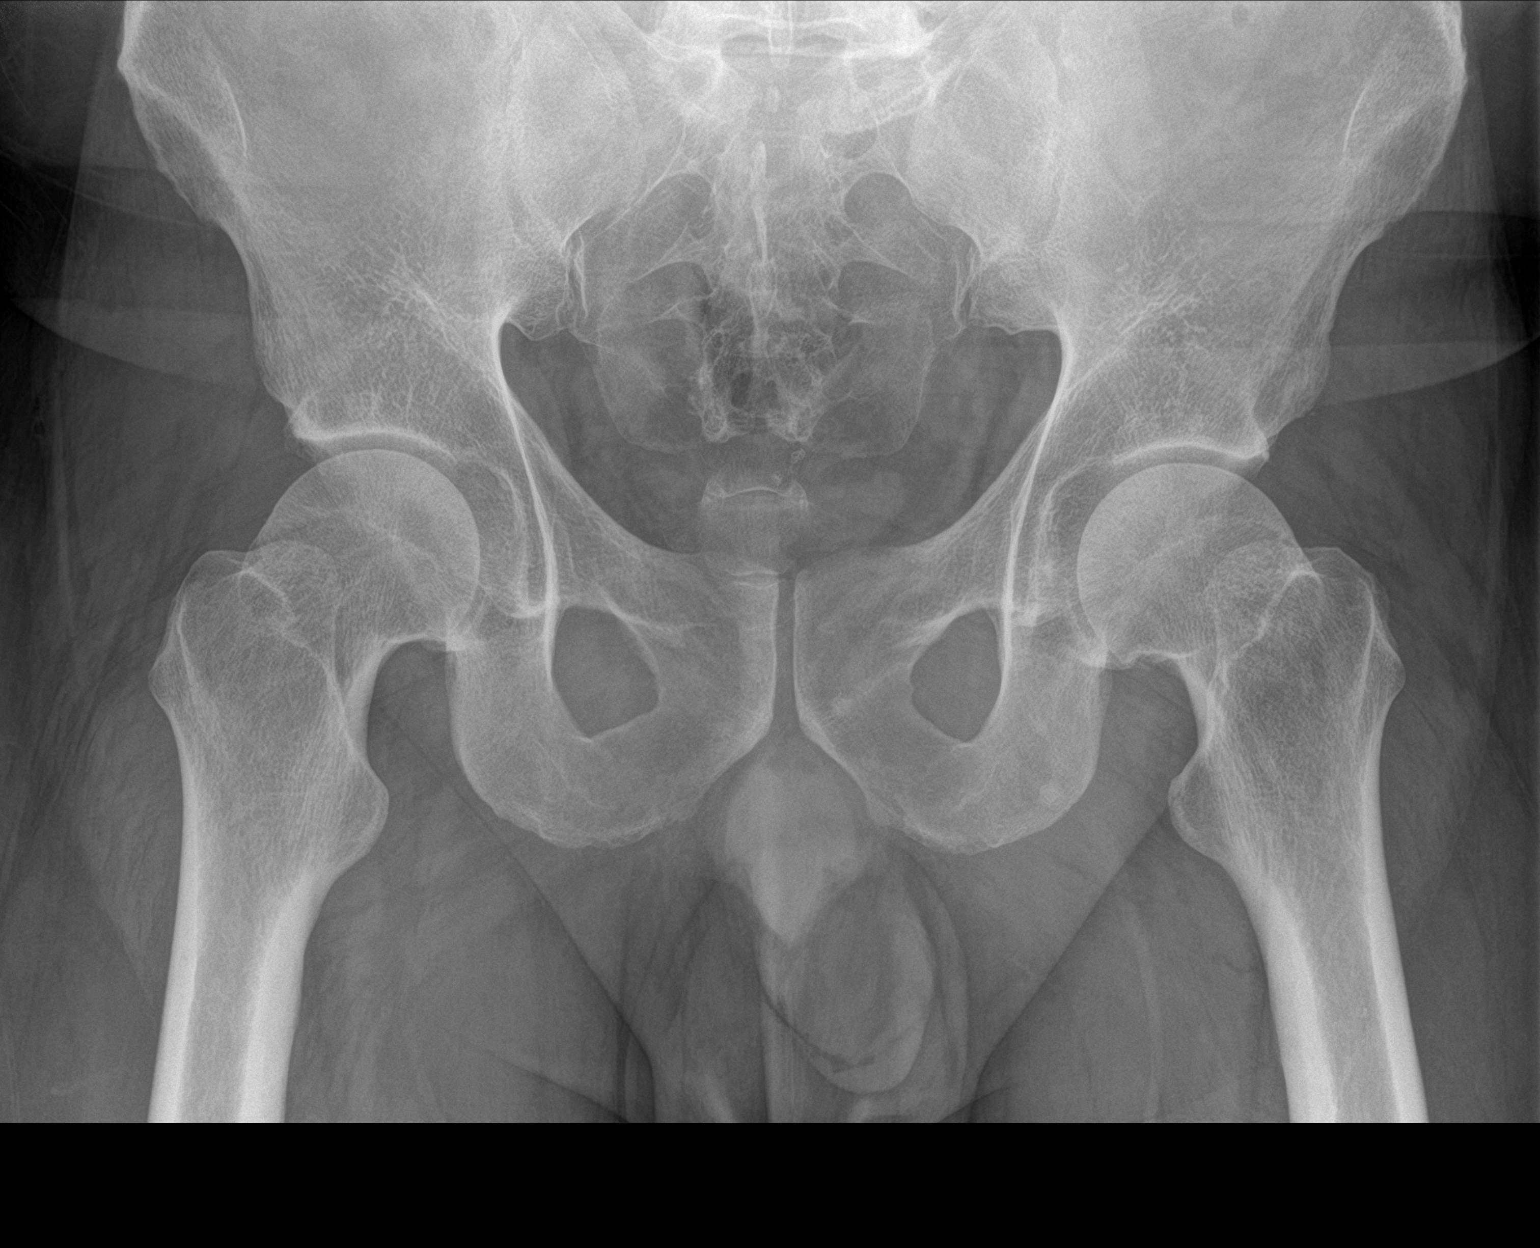

[abdomen kub (2 of 3)]
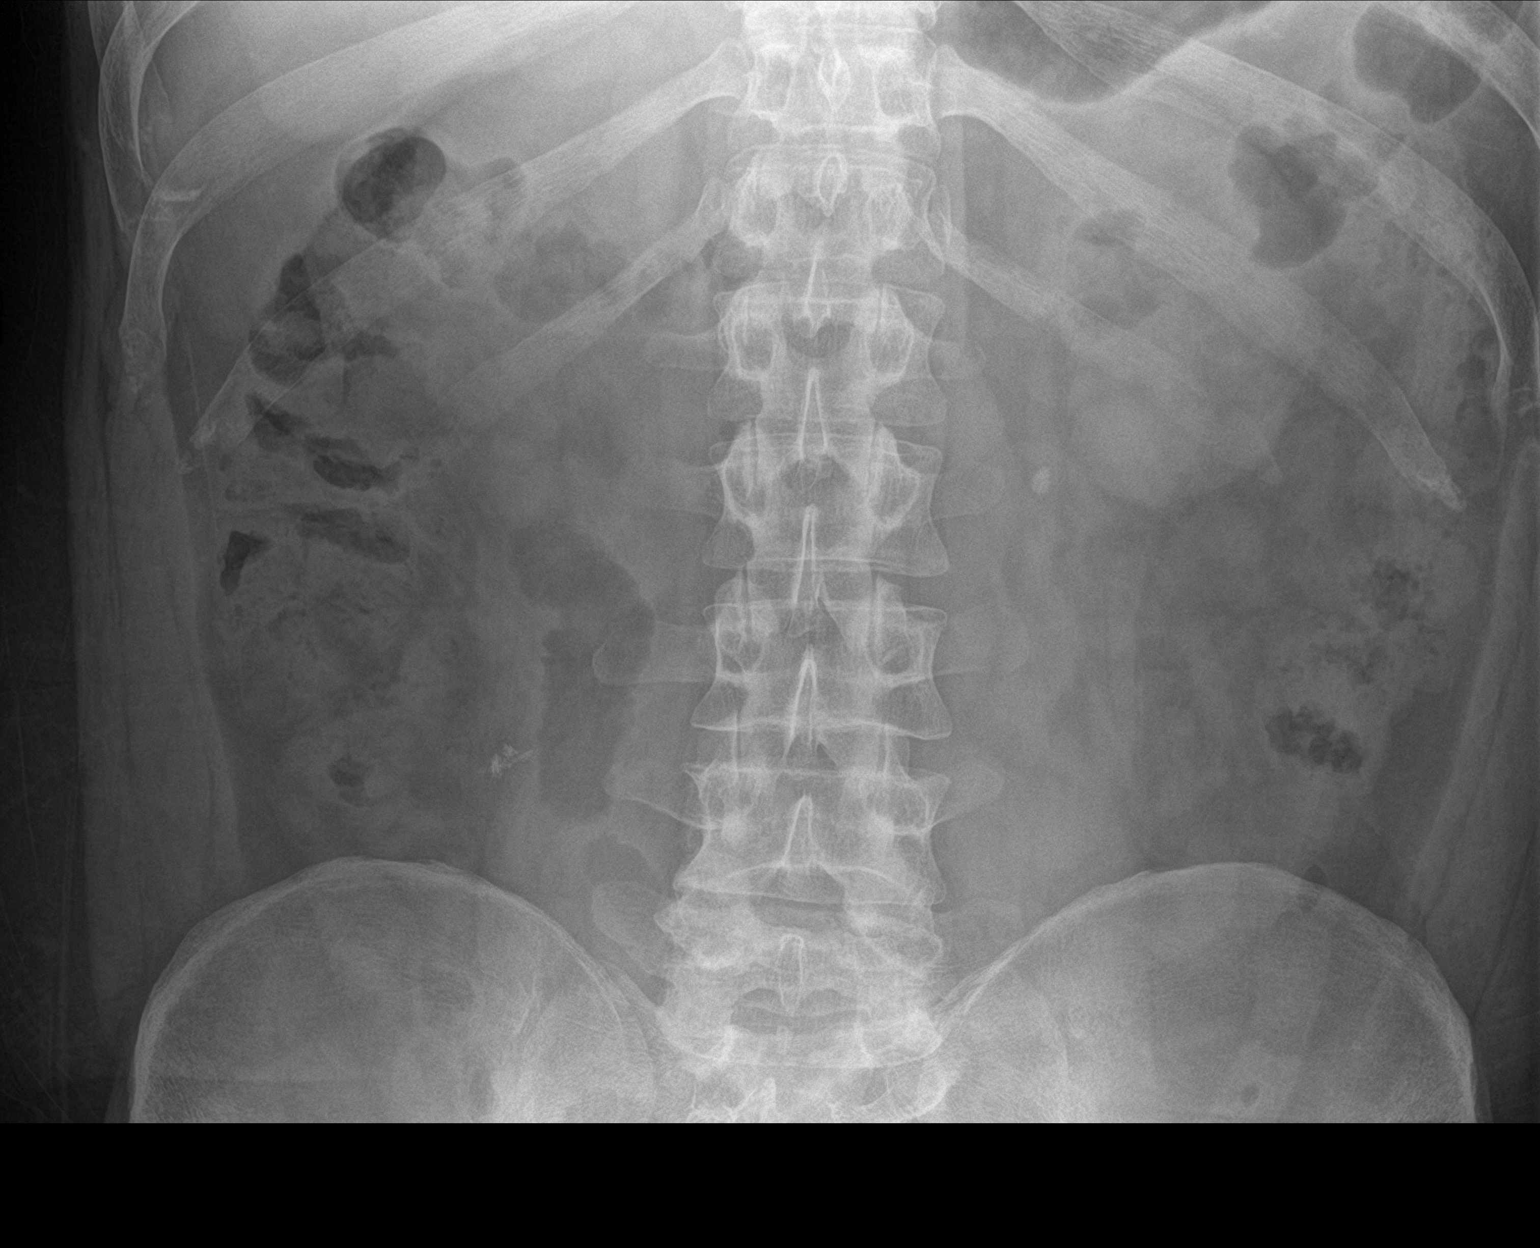

[abdomen kub (3 of 3)]
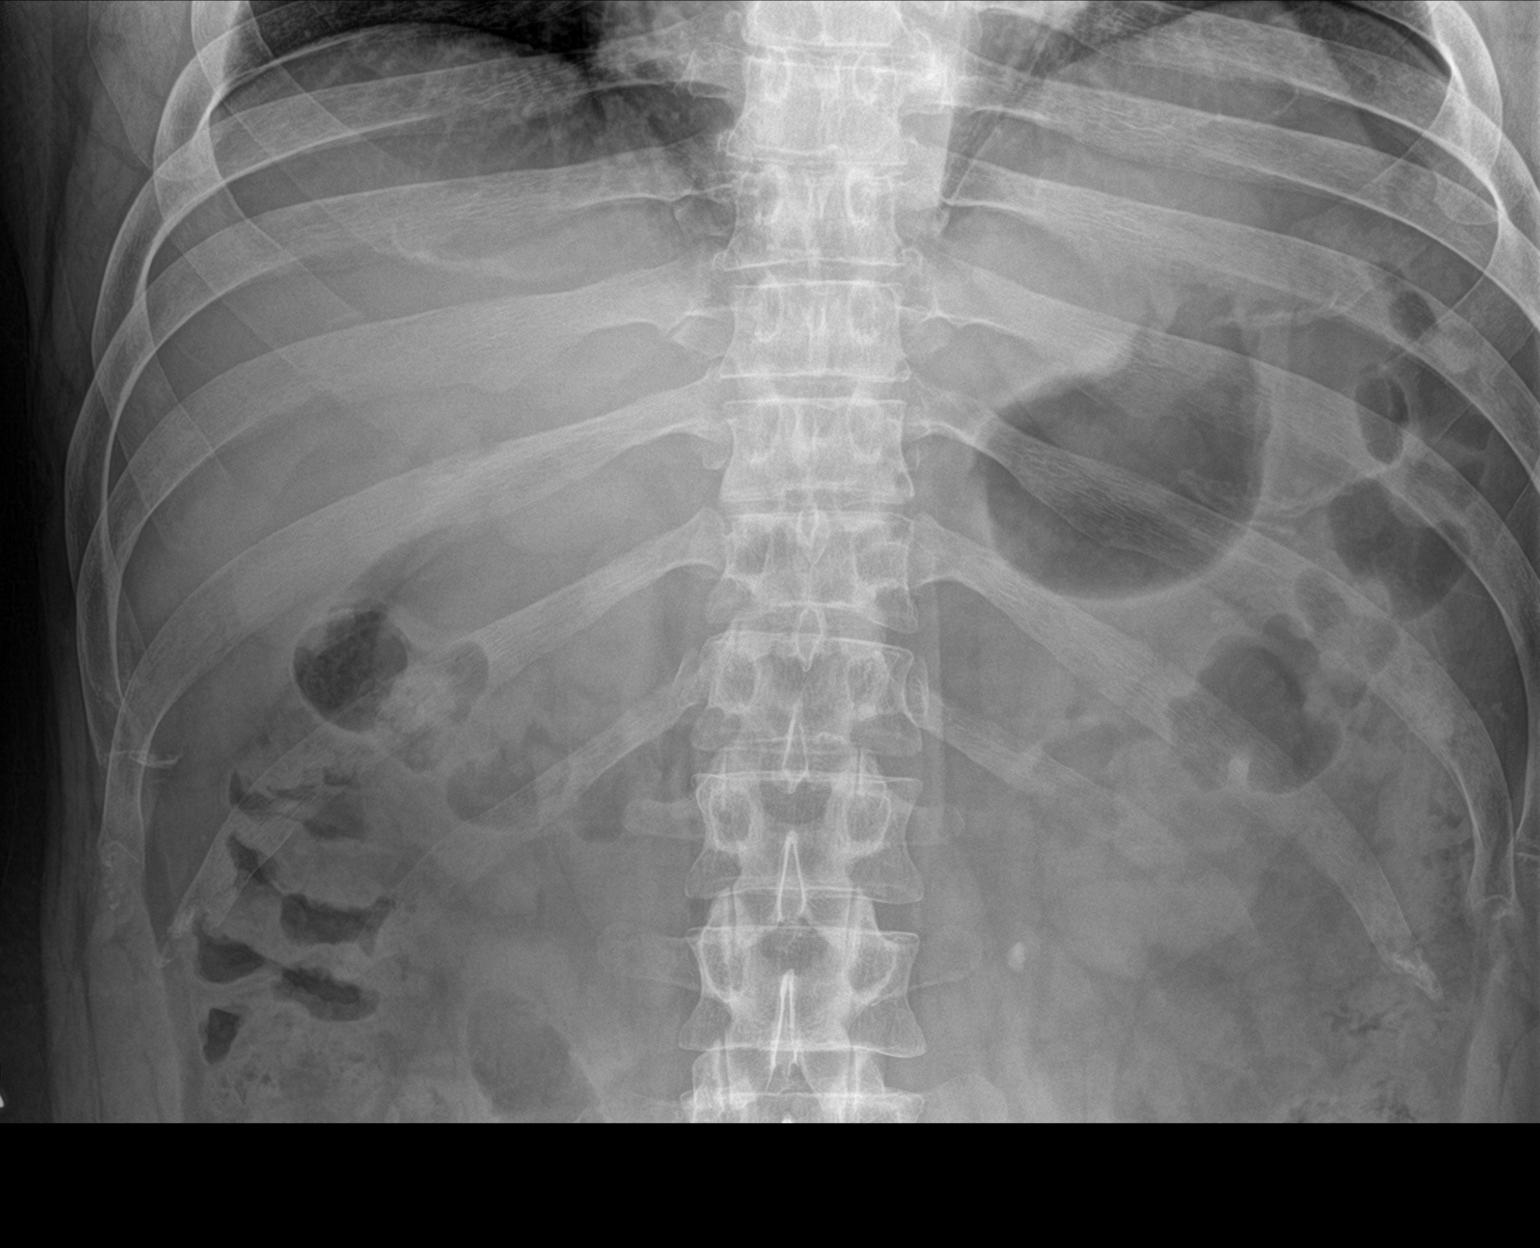

[3 of 3 positions shown; findings below may reference images not displayed]

FINDINGS: Bowel-gas pattern is unremarkable. There is a 7 mm calcific density
on the left at the level of L2, region of the proximal left ureter.
No additional calculi identified. Stable surgical changes in the
right lower abdomen.
IMPRESSION: 7 mm calcific density on the left at the level of L2, likely a
ureteral calculus.

## 2023-09-01 ENCOUNTER — Other Ambulatory Visit (HOSPITAL_COMMUNITY): Payer: Self-pay
